# Patient Record
Sex: Female | Born: 1945 | Race: White | Hispanic: No | Marital: Married | Smoking: Former smoker
Health system: Southern US, Community
[De-identification: ages and names within clinical notes are randomized; demographics above are authoritative.]

## PROBLEM LIST (undated history)

## (undated) DIAGNOSIS — G479 Sleep disorder, unspecified: Secondary | ICD-10-CM

## (undated) DIAGNOSIS — C7800 Secondary malignant neoplasm of unspecified lung: Secondary | ICD-10-CM

## (undated) DIAGNOSIS — Z515 Encounter for palliative care: Secondary | ICD-10-CM

## (undated) DIAGNOSIS — C3490 Malignant neoplasm of unspecified part of unspecified bronchus or lung: Secondary | ICD-10-CM

## (undated) DIAGNOSIS — G20 Parkinson's disease: Secondary | ICD-10-CM

## (undated) DIAGNOSIS — M25519 Pain in unspecified shoulder: Secondary | ICD-10-CM

## (undated) DIAGNOSIS — I10 Essential (primary) hypertension: Secondary | ICD-10-CM

## (undated) DIAGNOSIS — G20A1 Parkinson's disease without dyskinesia, without mention of fluctuations: Secondary | ICD-10-CM

## (undated) DIAGNOSIS — Z923 Personal history of irradiation: Secondary | ICD-10-CM

## (undated) DIAGNOSIS — E785 Hyperlipidemia, unspecified: Secondary | ICD-10-CM

## (undated) HISTORY — DX: Essential (primary) hypertension: I10

## (undated) HISTORY — DX: Pain in unspecified shoulder: M25.519

## (undated) HISTORY — DX: Personal history of irradiation: Z92.3

## (undated) HISTORY — DX: Parkinson's disease: G20

## (undated) HISTORY — DX: Secondary malignant neoplasm of unspecified lung: C78.00

## (undated) HISTORY — DX: Encounter for palliative care: Z51.5

## (undated) HISTORY — DX: Parkinson's disease without dyskinesia, without mention of fluctuations: G20.A1

## (undated) HISTORY — DX: Malignant neoplasm of unspecified part of unspecified bronchus or lung: C34.90

## (undated) HISTORY — DX: Sleep disorder, unspecified: G47.9

## (undated) HISTORY — PX: TONSILLECTOMY: SUR1361

## (undated) HISTORY — DX: Hyperlipidemia, unspecified: E78.5

---

## 1999-05-30 ENCOUNTER — Other Ambulatory Visit: Admission: RE | Admit: 1999-05-30 | Discharge: 1999-05-30 | Payer: Self-pay | Admitting: Obstetrics and Gynecology

## 1999-05-30 ENCOUNTER — Encounter (INDEPENDENT_AMBULATORY_CARE_PROVIDER_SITE_OTHER): Payer: Self-pay

## 1999-09-05 ENCOUNTER — Encounter: Payer: Self-pay | Admitting: Obstetrics and Gynecology

## 1999-09-05 ENCOUNTER — Encounter: Admission: RE | Admit: 1999-09-05 | Discharge: 1999-09-05 | Payer: Self-pay | Admitting: Obstetrics and Gynecology

## 1999-09-11 ENCOUNTER — Encounter: Payer: Self-pay | Admitting: Obstetrics and Gynecology

## 1999-09-11 ENCOUNTER — Encounter: Admission: RE | Admit: 1999-09-11 | Discharge: 1999-09-11 | Payer: Self-pay | Admitting: Obstetrics and Gynecology

## 2000-09-11 ENCOUNTER — Encounter: Payer: Self-pay | Admitting: Obstetrics and Gynecology

## 2000-09-11 ENCOUNTER — Encounter: Admission: RE | Admit: 2000-09-11 | Discharge: 2000-09-11 | Payer: Self-pay | Admitting: Obstetrics and Gynecology

## 2001-09-22 ENCOUNTER — Ambulatory Visit (HOSPITAL_COMMUNITY): Admission: RE | Admit: 2001-09-22 | Discharge: 2001-09-22 | Payer: Self-pay | Admitting: Obstetrics and Gynecology

## 2001-09-22 ENCOUNTER — Encounter: Payer: Self-pay | Admitting: Obstetrics and Gynecology

## 2001-09-28 ENCOUNTER — Encounter: Payer: Self-pay | Admitting: Obstetrics and Gynecology

## 2001-09-28 ENCOUNTER — Encounter: Admission: RE | Admit: 2001-09-28 | Discharge: 2001-09-28 | Payer: Self-pay | Admitting: Obstetrics and Gynecology

## 2001-12-21 ENCOUNTER — Encounter: Admission: RE | Admit: 2001-12-21 | Discharge: 2001-12-21 | Payer: Self-pay | Admitting: Internal Medicine

## 2001-12-21 ENCOUNTER — Encounter: Payer: Self-pay | Admitting: Internal Medicine

## 2002-05-28 ENCOUNTER — Encounter: Payer: Self-pay | Admitting: Internal Medicine

## 2002-05-28 ENCOUNTER — Ambulatory Visit (HOSPITAL_COMMUNITY): Admission: RE | Admit: 2002-05-28 | Discharge: 2002-05-28 | Payer: Self-pay | Admitting: Internal Medicine

## 2002-10-07 ENCOUNTER — Encounter: Payer: Self-pay | Admitting: Obstetrics and Gynecology

## 2002-10-07 ENCOUNTER — Ambulatory Visit (HOSPITAL_COMMUNITY): Admission: RE | Admit: 2002-10-07 | Discharge: 2002-10-07 | Payer: Self-pay | Admitting: Obstetrics and Gynecology

## 2003-10-13 ENCOUNTER — Encounter: Admission: RE | Admit: 2003-10-13 | Discharge: 2003-10-13 | Payer: Self-pay | Admitting: Obstetrics and Gynecology

## 2004-09-21 ENCOUNTER — Ambulatory Visit (HOSPITAL_COMMUNITY): Admission: RE | Admit: 2004-09-21 | Discharge: 2004-09-21 | Payer: Self-pay | Admitting: Gastroenterology

## 2004-09-21 ENCOUNTER — Encounter (INDEPENDENT_AMBULATORY_CARE_PROVIDER_SITE_OTHER): Payer: Self-pay | Admitting: *Deleted

## 2004-10-18 ENCOUNTER — Encounter: Admission: RE | Admit: 2004-10-18 | Discharge: 2004-10-18 | Payer: Self-pay | Admitting: Obstetrics and Gynecology

## 2004-10-30 ENCOUNTER — Encounter: Admission: RE | Admit: 2004-10-30 | Discharge: 2004-10-30 | Payer: Self-pay | Admitting: Obstetrics and Gynecology

## 2005-07-25 ENCOUNTER — Other Ambulatory Visit: Admission: RE | Admit: 2005-07-25 | Discharge: 2005-07-25 | Payer: Self-pay | Admitting: Internal Medicine

## 2005-08-01 ENCOUNTER — Encounter: Admission: RE | Admit: 2005-08-01 | Discharge: 2005-08-01 | Payer: Self-pay | Admitting: Internal Medicine

## 2005-09-10 ENCOUNTER — Other Ambulatory Visit: Admission: RE | Admit: 2005-09-10 | Discharge: 2005-09-10 | Payer: Self-pay | Admitting: Obstetrics and Gynecology

## 2005-10-30 ENCOUNTER — Encounter: Admission: RE | Admit: 2005-10-30 | Discharge: 2005-10-30 | Payer: Self-pay | Admitting: Internal Medicine

## 2006-07-14 ENCOUNTER — Ambulatory Visit (HOSPITAL_COMMUNITY): Admission: RE | Admit: 2006-07-14 | Discharge: 2006-07-14 | Payer: Self-pay | Admitting: Neurology

## 2006-08-26 ENCOUNTER — Other Ambulatory Visit: Admission: RE | Admit: 2006-08-26 | Discharge: 2006-08-26 | Payer: Self-pay | Admitting: Obstetrics and Gynecology

## 2006-10-28 ENCOUNTER — Encounter: Admission: RE | Admit: 2006-10-28 | Discharge: 2006-10-28 | Payer: Self-pay | Admitting: *Deleted

## 2006-11-05 ENCOUNTER — Encounter: Admission: RE | Admit: 2006-11-05 | Discharge: 2006-11-05 | Payer: Self-pay | Admitting: Obstetrics and Gynecology

## 2006-12-02 ENCOUNTER — Ambulatory Visit: Payer: Self-pay | Admitting: Vascular Surgery

## 2007-09-03 ENCOUNTER — Other Ambulatory Visit: Admission: RE | Admit: 2007-09-03 | Discharge: 2007-09-03 | Payer: Self-pay | Admitting: Obstetrics and Gynecology

## 2007-11-19 ENCOUNTER — Encounter: Admission: RE | Admit: 2007-11-19 | Discharge: 2007-11-19 | Payer: Self-pay | Admitting: Obstetrics and Gynecology

## 2008-09-15 ENCOUNTER — Other Ambulatory Visit: Admission: RE | Admit: 2008-09-15 | Discharge: 2008-09-15 | Payer: Self-pay | Admitting: Obstetrics and Gynecology

## 2008-11-22 ENCOUNTER — Encounter: Admission: RE | Admit: 2008-11-22 | Discharge: 2008-11-22 | Payer: Self-pay | Admitting: Family Medicine

## 2009-09-19 ENCOUNTER — Other Ambulatory Visit: Admission: RE | Admit: 2009-09-19 | Discharge: 2009-09-19 | Payer: Self-pay | Admitting: Obstetrics and Gynecology

## 2009-11-24 ENCOUNTER — Encounter: Admission: RE | Admit: 2009-11-24 | Discharge: 2009-11-24 | Payer: Self-pay | Admitting: Family Medicine

## 2010-06-17 HISTORY — PX: SHOULDER SURGERY: SHX246

## 2010-10-30 NOTE — Consult Note (Signed)
VASCULAR SURGERY CONSULTATION   Savard, Teressa B  DOB:  02/19/1946                                       12/02/2006  ZOXWR#:[id number]   HISTORY OF PRESENT ILLNESS:  The patient is a 65 year old healthy female  who was recently found to have a right carotid bruit.  She denies any  neurologic symptoms such as previous stroke, hemispheric or  nonhemispheric, TIA, amaurosis fugax, diplopia or blurred vision,  syncope, hemiparesis, aphagia, etc.  She had a carotid duplex exam  performed at [facility] on Oct 28, 2006 which revealed a  moderate right external carotid stenosis but no internal carotid flow  reduction bilaterally.  She had not previously had an ultrasound study  done of her carotid arteries.   PAST MEDICAL HISTORY:  Negative for diabetes, coronary artery disease,  stroke, COPD.  She does have a history of hypertension, hyperlipidemia  treated medically.  She has also had a tremor in her left upper  extremity treated by Dr. Thad Ranger and also has diagnosis of restless leg  syndrome.   PAST SURGICAL HISTORY:  Includes tonsillectomy and childbirth.   FAMILY HISTORY:  Positive for coronary artery disease in her father who  died of a myocardial infarction.  Negative for stroke and diabetes.   SOCIAL HISTORY:  She is married, works as a Merchandiser, retail.  Quit smoking 15-  20 years ago and drinks an occasional glass of wine.   REVIEW OF SYSTEMS AND MEDICATIONS:  Please see health history form.   ALLERGIES:  None known.   PHYSICAL EXAMINATION:  Vital signs:  Blood pressure is 165/95, heart  rate is 88, respirations 18.  General:  She is a healthy appearing  middle aged female in no apparent distress, alert and oriented times 3.  Neck:  Supple, 3+ carotid pulses palpable.  There is a soft bruit in the  right supraclavicular area.  No palpable adenopathy in the neck.  Neurologic:  Normal.  Upper extremity pulses are 3+ bilaterally at the  brachial and  radial level.  Chest:  Clear to auscultation.  Cardiovascular:  Regular rhythm, no murmurs.  Abdomen:  Soft and  nontender with no palpable masses.  She has 3+ femoral, popliteal, and  posterior tibial pulses bilaterally with no distal edema or venous  disease noted.   I discussed with her the importance of her ultrasound and the fact that  she does not require any treatment since there is no flow reduction in  her internal carotid arteries and she is totally asymptomatic.  The  external carotid stenosis does not require treatment.  She does have  some atherosclerosis obviously to have this finding so I would repeat  her study in a couple of years or if she develops symptoms in the  interim; but otherwise she does not need any specific followup.   Quita Skye Hart Rochester, M.D.  Electronically Signed  JDL/MEDQ  D:  12/02/2006  T:  12/03/2006  Job:  [id number]   cc:   Fredirick Lathe, M.D.

## 2010-11-02 NOTE — Op Note (Signed)
NAMECAROLINA, [name] Pacheco               ACCOUNT NO.:  [id number]   MEDICAL RECORD NO.:  [id number]          PATIENT TYPE:  AMB   LOCATION:  ENDO                         FACILITY:  [facility]   PHYSICIAN:  Anselmo Rod, M.D.  DATE OF BIRTH:  1946-04-24   DATE OF PROCEDURE:  09/21/2004  DATE OF DISCHARGE:                                 OPERATIVE REPORT   PROCEDURE PERFORMED:  Colonoscopy with biopsies.   ENDOSCOPIST:  Anselmo Rod, M.D.   INSTRUMENT USED:  Olympus video colonoscope.   INDICATIONS FOR PROCEDURE:  A 65 year old white female undergoing screening  colonoscopy.  The patient has a history of occasional BRBPR.  Rule out  colonic polyps, masses, etc.   PRE-PROCEDURE PREPARATION:  Informed consent was procured from the patient.  The patient was fasted for eight hours prior to the procedure and prepped  with a bottle of magnesium citrate and a gallon of GoLYTELY the night prior  to the procedure.  Risks and benefits of the procedure including a 10% miss  rate of cancer and polyps were discussed with the patient as well.   PRE-PROCEDURE PHYSICAL:  VITAL SIGNS:  The patient had stable vital signs.  NECK:  Supple.  CHEST:  Clear to auscultation.  CARDIOVASCULAR:  S1, S2 regular.  ABDOMEN:  Soft, with normal bowel sounds.   DESCRIPTION OF THE PROCEDURE:  The patient was placed in the left lateral  decubitus position, sedated with 75 mg of Demerol and 7.5 mg of Versed in  slow incremental doses.  Once the patient was adequately sedated and  maintained on low-flow oxygen and continuous cardiac monitoring, the Olympus  video colonoscope was advanced from the rectum to the cecum.  The  appendiceal orifice and the ileocecal valve were clearly visualized and  photographed.  No masses, polyps, erosions, or ulcerations were seen.  There  was evidence of diverticulosis throughout the colon, with most of the  diverticula in the early stages of formation.  Retroflexion in the rectum  revealed a small internal hemorrhoid.  A prominent fold with a question  diverticulum was seen in the rectosigmoid colon.  There was some erythema on  this fold.  Biopsies were done to rule out adenoma.  The patient tolerated  the procedure well, without complications.  The terminal ileum appeared  healthy and without lesions.   IMPRESSION:  1.  Small external hemorrhoid.  2.  Diverticulosis throughout the colon, in early stages of formation.  3.  Prominent fold, with a question diverticulum, biopsied from rectosigmoid      colon.  4.  Normal terminal ileum.   RECOMMENDATIONS:  1.  Await biopsy results.  2.  Avoid nonsteroidals, including aspirin, for the next two weeks.  3.  Outpatient followup in the next two weeks for further recommendations.      JNM/MEDQ  D:  09/21/2004  T:  09/21/2004  Job:  [id number]   cc:   Marcene Duos, M.D.  [address]  [place]  [place] [address]  Fax: [phone]

## 2010-11-08 ENCOUNTER — Other Ambulatory Visit (HOSPITAL_BASED_OUTPATIENT_CLINIC_OR_DEPARTMENT_OTHER): Payer: Self-pay | Admitting: Orthopedic Surgery

## 2010-11-08 DIAGNOSIS — R52 Pain, unspecified: Secondary | ICD-10-CM

## 2010-11-14 ENCOUNTER — Ambulatory Visit (HOSPITAL_BASED_OUTPATIENT_CLINIC_OR_DEPARTMENT_OTHER)
Admission: RE | Admit: 2010-11-14 | Discharge: 2010-11-14 | Disposition: A | Discharge status: Home / Self Care | Payer: BC Managed Care – PPO | Source: Ambulatory Visit | Attending: Orthopedic Surgery | Admitting: Orthopedic Surgery

## 2010-11-14 DIAGNOSIS — M25519 Pain in unspecified shoulder: Secondary | ICD-10-CM | POA: Insufficient documentation

## 2010-11-14 DIAGNOSIS — M67919 Unspecified disorder of synovium and tendon, unspecified shoulder: Secondary | ICD-10-CM | POA: Insufficient documentation

## 2010-11-14 DIAGNOSIS — R52 Pain, unspecified: Secondary | ICD-10-CM

## 2010-11-14 DIAGNOSIS — M719 Bursopathy, unspecified: Secondary | ICD-10-CM | POA: Insufficient documentation

## 2010-11-14 DIAGNOSIS — M19019 Primary osteoarthritis, unspecified shoulder: Secondary | ICD-10-CM | POA: Insufficient documentation

## 2010-12-07 ENCOUNTER — Encounter (HOSPITAL_BASED_OUTPATIENT_CLINIC_OR_DEPARTMENT_OTHER)
Admission: RE | Admit: 2010-12-07 | Discharge: 2010-12-07 | Disposition: A | Discharge status: Home / Self Care | Payer: BC Managed Care – PPO | Source: Ambulatory Visit | Attending: Orthopedic Surgery | Admitting: Orthopedic Surgery

## 2010-12-07 LAB — BASIC METABOLIC PANEL
BUN: 18 mg/dL (ref 6–23)
CO2: 29 mEq/L (ref 19–32)
Calcium: 9.5 mg/dL (ref 8.4–10.5)
Chloride: 103 mEq/L (ref 96–112)
Creatinine, Ser: 0.58 mg/dL (ref 0.50–1.10)
Glucose, Bld: 98 mg/dL (ref 70–99)
Potassium: 3.5 mEq/L (ref 3.5–5.1)

## 2010-12-10 ENCOUNTER — Ambulatory Visit (HOSPITAL_BASED_OUTPATIENT_CLINIC_OR_DEPARTMENT_OTHER)
Admission: RE | Admit: 2010-12-10 | Discharge: 2010-12-10 | Disposition: A | Discharge status: Home / Self Care | Payer: BC Managed Care – PPO | Source: Ambulatory Visit | Attending: Orthopedic Surgery | Admitting: Orthopedic Surgery

## 2010-12-10 DIAGNOSIS — M25819 Other specified joint disorders, unspecified shoulder: Secondary | ICD-10-CM | POA: Insufficient documentation

## 2010-12-10 DIAGNOSIS — Z01810 Encounter for preprocedural cardiovascular examination: Secondary | ICD-10-CM | POA: Insufficient documentation

## 2010-12-10 DIAGNOSIS — M75110 Incomplete rotator cuff tear or rupture of unspecified shoulder, not specified as traumatic: Secondary | ICD-10-CM | POA: Insufficient documentation

## 2010-12-10 DIAGNOSIS — Z01812 Encounter for preprocedural laboratory examination: Secondary | ICD-10-CM | POA: Insufficient documentation

## 2010-12-10 DIAGNOSIS — M24119 Other articular cartilage disorders, unspecified shoulder: Secondary | ICD-10-CM | POA: Insufficient documentation

## 2010-12-10 LAB — POCT HEMOGLOBIN-HEMACUE: Hemoglobin: 14.3 g/dL (ref 12.0–15.0)

## 2010-12-26 NOTE — Op Note (Signed)
[name], [name]               ACCOUNT NO.:  [id number]  MEDICAL RECORD NO.:  [id number]  LOCATION:  XRAY                          FACILITY:  [facility]  PHYSICIAN:  Shaylinn Hladik A. Thurston Hole, M.D. DATE OF BIRTH:  1945/07/28  DATE OF PROCEDURE:  12/10/2010 DATE OF DISCHARGE:  11/14/2010                              OPERATIVE REPORT   PREOPERATIVE DIAGNOSES: 1. Left shoulder partial labrum tear. 2. Left shoulder rotator cuff tendonitis with impingement.  POSTOPERATIVE DIAGNOSES: 1. Left shoulder partial labrum tear. 2. Left shoulder rotator cuff tendonitis with impingement.  PROCEDURE: 1. Left shoulder exam under anesthesia followed by arthroscopic     debridement and partial labrum tear. 2. Left shoulder subacromial decompression.  SURGEON:  Elana Alm. Thurston Hole, MD  ANESTHESIA:  General.  OPERATIVE TIME:  45 minutes.  COMPLICATIONS:  None.  INDICATIONS FOR PROCEDURE:  [name] Pacheco is a 65 year old woman who has had significant increasing left shoulder pain over the past 6 months with exam and MRI documenting partial labrum and biceps tear with impingement.  She has failed conservative care and is now to undergo arthroscopy.  DESCRIPTION:  [name] Pacheco was brought to the operating room on December 10, 2010, after an interscalene block was placed in the holding room by Anesthesia.  She was placed on the operating table in supine position. She received Ancef 1 g IV preoperatively for prophylaxis.  After being placed under general anesthesia, her left shoulder was examined.  She had full range of motion and her shoulder was stable to ligamentous exam.  She was then placed in a beach-chair position.  Her shoulder and arm was prepped using sterile DuraPrep and draped using sterile technique.  Time-out procedure was called and the correct left shoulder identified.  Initially, through a posterior arthroscopic portal the arthroscope with a pump attached was placed into an anterior portal  and arthroscopic probe was placed.  On initial inspection, the articular cartilage in the glenohumeral joint was found to be intact.  She had partial tearing of the anterior, superior and posterior labrum 25% which was debrided.  The anterior-inferior labrum and anterior-inferior glenohumeral ligament complex was intact.  Biceps tendon anchor was slightly hypermobile but was otherwise well attached.  Biceps tendon was intact.  Rotator cuff was thoroughly inspected on the articular surface and there was found to be no evidence of a tear.  Inferior capsular recess free of pathology.  Subacromial space was entered and lateral arthroscopic portal was made.  Moderately thickened bursitis was resected.  The rotator cuff was somewhat inflamed and thickened on the bursal surface but no evidence of a tear.  Impingement was noted and a subacromial decompression was carried out removing 6 mm of the undersurface of the anterior, anterolateral, and anteromedial acromion and CA ligament release was carried out as well.  The [facility] joint was not pathologic and thus was not resected.  After this was done, shoulder could be brought through a satisfactory range of motion with no impingement on the rotator cuff.  At this point, it was felt that all pathology have been satisfactorily addressed.  The instruments removed. Portals closed with 3-0 nylon suture.  Sterile dressings and a sling applied and  the patient awakened and taken to recovery in stable condition.  FOLLOWUP CARE:  [name] will be followed as an outpatient on Percocet and Mobic.  She will be back in the office in a week for sutures out and followup.     [name] Pacheco A. Thurston Hole, M.D.     RAW/MEDQ  D:  12/10/2010  T:  12/10/2010  Job:  [id number]  Electronically Signed by Salvatore Marvel M.D. on 12/26/2010 11:26:19 AM

## 2011-01-27 ENCOUNTER — Encounter: Payer: Self-pay | Admitting: Emergency Medicine

## 2011-01-27 ENCOUNTER — Inpatient Hospital Stay (INDEPENDENT_AMBULATORY_CARE_PROVIDER_SITE_OTHER)
Admission: RE | Admit: 2011-01-27 | Discharge: 2011-01-27 | Disposition: A | Discharge status: Home / Self Care | Payer: BC Managed Care – PPO | Source: Ambulatory Visit | Attending: Emergency Medicine | Admitting: Emergency Medicine

## 2011-01-27 DIAGNOSIS — J209 Acute bronchitis, unspecified: Secondary | ICD-10-CM | POA: Insufficient documentation

## 2011-01-27 DIAGNOSIS — J0100 Acute maxillary sinusitis, unspecified: Secondary | ICD-10-CM

## 2011-01-27 DIAGNOSIS — E785 Hyperlipidemia, unspecified: Secondary | ICD-10-CM | POA: Insufficient documentation

## 2011-01-27 DIAGNOSIS — I10 Essential (primary) hypertension: Secondary | ICD-10-CM | POA: Insufficient documentation

## 2011-02-20 ENCOUNTER — Other Ambulatory Visit (HOSPITAL_BASED_OUTPATIENT_CLINIC_OR_DEPARTMENT_OTHER): Payer: Self-pay | Admitting: Pediatrics

## 2011-02-20 DIAGNOSIS — Z1231 Encounter for screening mammogram for malignant neoplasm of breast: Secondary | ICD-10-CM

## 2011-02-26 ENCOUNTER — Ambulatory Visit (HOSPITAL_BASED_OUTPATIENT_CLINIC_OR_DEPARTMENT_OTHER)
Admission: RE | Admit: 2011-02-26 | Discharge: 2011-02-26 | Disposition: A | Discharge status: Home / Self Care | Payer: BC Managed Care – PPO | Source: Ambulatory Visit | Attending: Pediatrics | Admitting: Pediatrics

## 2011-02-26 DIAGNOSIS — Z1231 Encounter for screening mammogram for malignant neoplasm of breast: Secondary | ICD-10-CM | POA: Insufficient documentation

## 2011-05-20 NOTE — Progress Notes (Signed)
Summary: CONGESTION(rm4)   Vital Signs:  Patient Profile:   65 Years Old Female CC:      congestion Height:     64 inches Weight:      170.8 pounds O2 Sat:      99 % O2 treatment:    Room Air Temp:     98.1 degrees F oral Pulse rate:   73 / minute Resp:     24 per minute BP sitting:   128 / 84  (left arm)  Vitals Entered By: Linton Flemings RN (January 27, 2011 1:22 PM)                  Updated Prior Medication List: MIRAPEX 1 MG TABS (PRAMIPEXOLE DIHYDROCHLORIDE) 3 1/2 tabs daily ZETIA 10 MG TABS (EZETIMIBE) daily ZOCOR 20 MG TABS (SIMVASTATIN) daily ASPIRIN 81 MG TBEC (ASPIRIN) daily CALTRATE 600+D 600-400 MG-UNIT TABS (CALCIUM CARBONATE-VITAMIN D) daily GLUCOSAMINE 500 MG CAPS (GLUCOSAMINE SULFATE) 2 tabs daily * PRILOSEC daily AMBIEN CR 6.25 MG CR-TABS (ZOLPIDEM TARTRATE) daily * LOPRESSOR 25 MG TABS (METOPROLOL TARTRATE) daily healthy women D3 MVI Current Allergies: No known allergies History of Present Illness History from: patient Chief Complaint: congestion History of Present Illness: Pt complains of  4 days of congestion, colored rhinohrhea No sore throat.  + cough, No dyspnea. No chest pain.  + rare wheezing.  No nausea No vomiting. + fever, No chills otc meds not helping.   REVIEW OF SYSTEMS Constitutional Symptoms       Complains of fever and chills.     Denies night sweats, weight loss, weight gain, and fatigue.  Eyes       Complains of glasses.      Denies change in vision, eye pain, eye discharge, contact lenses, and eye surgery. Ear/Nose/Throat/Mouth       Complains of frequent runny nose, sore throat, and hoarseness.      Denies hearing loss/aids, change in hearing, ear pain, ear discharge, dizziness, frequent nose bleeds, sinus problems, and tooth pain or bleeding.  Respiratory       Complains of dry cough, wheezing, and shortness of breath.      Denies productive cough, asthma, bronchitis, and emphysema/COPD.  Cardiovascular  Complains of chest pain.      Denies murmurs and tires easily with exhertion.      Comments: from coughing   Gastrointestinal       Complains of nausea/vomiting.      Denies stomach pain, diarrhea, constipation, blood in bowel movements, and indigestion.      Comments: nausea Genitourniary       Denies painful urination, kidney stones, and loss of urinary control. Neurological       Complains of headaches.      Denies paralysis, seizures, and fainting/blackouts. Musculoskeletal       Denies muscle pain, joint pain, joint stiffness, decreased range of motion, redness, swelling, muscle weakness, and gout.  Skin       Denies bruising, unusual mles/lumps or sores, and hair/skin or nail changes.  Psych       Denies mood changes, temper/anger issues, anxiety/stress, speech problems, depression, and sleep problems. Other Comments: symptoms started Thursday and getting worse.   Past History:  Past Medical History: Hyperlipidemia Hypertension  Past Surgical History: shoulder  Social History: smoke- no alcohol-yes- glass of wine daily rec.drugs-no Physical Exam General appearance: well developed, well nourished, no acute distress Head: normocephalic, atraumatic. Sounds congested. Eyes: conjunctivae and lids normal Ears: normal, no lesions or deformities  Nasal: swollen red turbinates with congestion. Yellow d/c Oral/Pharynx: tongue normal, posterior pharynx without erythema or exudate Neck: neck supple,  trachea midline, no masses Chest/Lungs: scattered rhonchi. Rare late exp wheezes, but good air movement. No rales. Heart: regular rate and  rhythm, no murmur Extremities: normal extremities Skin: no obvious rashes or lesions MSE: oriented to time, place, and person Assessment New Problems: ACUTE BRONCHITIS (ICD-466.0) ACUTE MAXILLARY SINUSITIS (ICD-461.0) HYPERTENSION (ICD-401.9) HYPERLIPIDEMIA (ICD-272.4)   Patient Education: Patient and/or caregiver instructed in the  following: rest fluids and Tylenol.  Plan New Medications/Changes: PROMETHAZINE-DM 6.25-15 MG/5ML SYRP (PROMETHAZINE-DM) 1 or 2 teaspoons q 4-6 hours as needed for cough  #120 ml x 0, 01/27/2011, Lajean Manes MD VENTOLIN HFA 108 (90 BASE) MCG/ACT AERS (ALBUTEROL SULFATE) 2 INH q 4-6 hrs as needed for wheezing  #1 x 0, 01/27/2011, Lajean Manes MD AMOXICILLIN 500 MG CAPS (AMOXICILLIN) 1 by mouth three times a day for 10 days  #30 x 0, 01/27/2011, Lajean Manes MD  New Orders: Services provided After hours-Weekends-Holidays [99051] Est. Patient Level IV [86578] Planning Comments:   Mucinex, other otc methods discussed. Risks, benefits, alternatives discussed. Pt voiced understanding and agreement.  Follow Up: Follow up on an as needed basis, Follow up with Primary Physician  The patient and/or caregiver has been counseled thoroughly with regard to medications prescribed including dosage, schedule, interactions, rationale for use, and possible side effects and they verbalize understanding.  Diagnoses and expected course of recovery discussed and will return if not improved as expected or if the condition worsens. Patient and/or caregiver verbalized understanding.  Prescriptions: PROMETHAZINE-DM 6.25-15 MG/5ML SYRP (PROMETHAZINE-DM) 1 or 2 teaspoons q 4-6 hours as needed for cough  #120 ml x 0   Entered and Authorized by:   Lajean Manes MD   Signed by:   Lajean Manes MD on 01/27/2011   Method used:   Electronically to        [facility] [phone]* (retail)       [address]       [place], [place]  [address]       Ph: [phone]       Fax: [phone]   RxID:   [id number] VENTOLIN HFA 108 (90 BASE) MCG/ACT AERS (ALBUTEROL SULFATE) 2 INH q 4-6 hrs as needed for wheezing  #1 x 0   Entered and Authorized by:   Lajean Manes MD   Signed by:   Lajean Manes MD on 01/27/2011   Method used:   Electronically to        [facility] [phone]* (retail)        [address]       [place], [place]  [address]       Ph: [phone]       Fax: [phone]   RxID:   [id number] AMOXICILLIN 500 MG CAPS (AMOXICILLIN) 1 by mouth three times a day for 10 days  #30 x 0   Entered and Authorized by:   Lajean Manes MD   Signed by:   Lajean Manes MD on 01/27/2011   Method used:   [facility]ronically to        [facility] [phone]* (retail)       [address]       [place], [place]  [address]       Ph: [phone]  Fax: [phone]   RxID:   [id number]   Orders Added: 1)  Services provided After hours-Weekends-Holidays [99051] 2)  Est. Patient Level IV [30865]

## 2011-06-26 ENCOUNTER — Other Ambulatory Visit: Payer: Self-pay | Admitting: Dermatology

## 2011-09-26 ENCOUNTER — Other Ambulatory Visit: Payer: Self-pay | Admitting: Dermatology

## 2012-03-05 ENCOUNTER — Other Ambulatory Visit (HOSPITAL_BASED_OUTPATIENT_CLINIC_OR_DEPARTMENT_OTHER): Payer: Self-pay | Admitting: Family Medicine

## 2012-03-05 ENCOUNTER — Ambulatory Visit (HOSPITAL_BASED_OUTPATIENT_CLINIC_OR_DEPARTMENT_OTHER)
Admission: RE | Admit: 2012-03-05 | Discharge: 2012-03-05 | Disposition: A | Discharge status: Home / Self Care | Payer: BC Managed Care – PPO | Source: Ambulatory Visit | Attending: Family Medicine | Admitting: Family Medicine

## 2012-03-05 DIAGNOSIS — Z1231 Encounter for screening mammogram for malignant neoplasm of breast: Secondary | ICD-10-CM

## 2012-07-10 DIAGNOSIS — G479 Sleep disorder, unspecified: Secondary | ICD-10-CM | POA: Insufficient documentation

## 2012-08-11 ENCOUNTER — Ambulatory Visit (INDEPENDENT_AMBULATORY_CARE_PROVIDER_SITE_OTHER): Payer: Medicare Other | Admitting: Family Medicine

## 2012-08-11 ENCOUNTER — Encounter: Payer: Self-pay | Admitting: Family Medicine

## 2012-08-11 VITALS — BP 130/83 | HR 63 | Ht 63.0 in | Wt 172.0 lb

## 2012-08-11 DIAGNOSIS — M25519 Pain in unspecified shoulder: Secondary | ICD-10-CM

## 2012-08-11 DIAGNOSIS — M25512 Pain in left shoulder: Secondary | ICD-10-CM

## 2012-08-11 MED ORDER — METHYLPREDNISOLONE 4 MG PO KIT: PACK | ORAL | Status: DC

## 2012-08-11 NOTE — Patient Instructions (Addendum)
Your exam is suggestive of muscle spasms of trapezius, pectoralis, subscapular muscles or irritated nerve in your neck. Take prednisone as directed x 6 days with food. AFTER finishing this it's ok to go back to advil 3 tabs three times a day with food. Start physical therapy and home exercise program. Follow up with me in 1 month for reevaluation.

## 2012-08-12 ENCOUNTER — Ambulatory Visit: Payer: Medicare Other | Attending: Family Medicine | Admitting: Rehabilitation

## 2012-08-12 ENCOUNTER — Encounter: Payer: Self-pay | Admitting: Family Medicine

## 2012-08-12 DIAGNOSIS — M25512 Pain in left shoulder: Secondary | ICD-10-CM | POA: Insufficient documentation

## 2012-08-12 DIAGNOSIS — M542 Cervicalgia: Secondary | ICD-10-CM | POA: Insufficient documentation

## 2012-08-12 DIAGNOSIS — M25519 Pain in unspecified shoulder: Secondary | ICD-10-CM | POA: Insufficient documentation

## 2012-08-12 DIAGNOSIS — IMO0001 Reserved for inherently not codable concepts without codable children: Secondary | ICD-10-CM | POA: Insufficient documentation

## 2012-08-12 NOTE — Progress Notes (Signed)
  Subjective:    Patient ID: [name] Pacheco, female    DOB: Jan 23, 1946, 67 y.o.   MRN: [id number]  PCP: Claude Manges  HPI 67 yo F here for left shoulder pain.  Patient reports about 3 weeks ago she recalls reaching and handling a box higher up on a shelf. Felt uncomfortable to do so but no acute injury. Pain started in left shoulder area but included upper back, chest, upper arm. Hurts with certain movements including pushing hands together, lifting. No night pain. Constant ache that has gotten worse over past 3 weeks. Taking advil, using heat. No numbness or tingling. No swelling or bruising.  Past Medical History  Diagnosis Date  . Hypertension   . Hyperlipidemia   . Parkinson disease     No current outpatient prescriptions on file prior to visit.   No current facility-administered medications on file prior to visit.    Past Surgical History  Procedure Laterality Date  . Tonsillectomy    . Shoulder surgery Left 2012    arthroscopy, debridement    No Known Allergies  History   Social History  . Marital Status: Married    Spouse Name: N/A    Number of Children: N/A  . Years of Education: N/A   Occupational History  . Not on file.   Social History Main Topics  . Smoking status: Former Games developer  . Smokeless tobacco: Not on file  . Alcohol Use: Not on file  . Drug Use: Not on file  . Sexually Active: Not on file   Other Topics Concern  . Not on file   Social History Narrative  . No narrative on file    Family History  Problem Relation Age of Onset  . Sudden death Mother   . Sudden death Father   . Heart attack Father     BP 130/83  Pulse 63  Ht 5\' 3"  (1.6 m)  Wt 172 lb (78.019 kg)  BMI 30.48 kg/m2  Review of Systems See HPI above.    Objective:   Physical Exam Gen: NAD  Neck: No gross deformity, swelling, bruising. TTP upper trapezius, pectoralis and upper arm reproducing her pain.  No midline/bony TTP. FROM neck without pain. BUE  strength 5/5.   Sensation intact to light touch.   2+ equal reflexes in triceps, biceps, brachioradialis tendons. Negative spurlings. NV intact distal BUEs.  L shoulder: No swelling, ecchymoses.  No gross deformity. No TTP. FROM. Negative Hawkins, Neers. Negative Speeds, Yergasons. Strength 5/5 with empty can and resisted internal/external rotation.  Pain reproduced with resisted IR. Negative apprehension. NV intact distally.    Assessment & Plan:  1. Left shoulder pain - Consistent with muscle spasms of trapezius, pectoralis, subscapular muscles based on resisted motions, exam, started when moving a box on a high shelf.  Start with physical therapy, home exercise program.  She will trial prednisone as this could represent irritated cervical nerve causing spasms in these muscle groups as well.  After finishing this, nsaids, tylenol as needed.  Heat as needed.  F/u in 1 month.

## 2012-08-12 NOTE — Assessment & Plan Note (Signed)
Consistent with muscle spasms of trapezius, pectoralis, subscapular muscles based on resisted motions, exam, started when moving a box on a high shelf.  Start with physical therapy, home exercise program.  She will trial prednisone as this could represent irritated cervical nerve causing spasms in these muscle groups as well.  After finishing this, nsaids, tylenol as needed.  Heat as needed.  F/u in 1 month.

## 2012-08-19 ENCOUNTER — Ambulatory Visit: Payer: Medicare Other | Attending: Family Medicine | Admitting: Rehabilitation

## 2012-08-19 DIAGNOSIS — IMO0001 Reserved for inherently not codable concepts without codable children: Secondary | ICD-10-CM | POA: Insufficient documentation

## 2012-08-19 DIAGNOSIS — M542 Cervicalgia: Secondary | ICD-10-CM | POA: Insufficient documentation

## 2012-08-19 DIAGNOSIS — M25519 Pain in unspecified shoulder: Secondary | ICD-10-CM | POA: Insufficient documentation

## 2012-08-20 ENCOUNTER — Ambulatory Visit (INDEPENDENT_AMBULATORY_CARE_PROVIDER_SITE_OTHER): Payer: Medicare Other | Admitting: Family Medicine

## 2012-08-20 ENCOUNTER — Encounter: Payer: Self-pay | Admitting: Family Medicine

## 2012-08-20 VITALS — BP 122/83 | Ht 63.0 in | Wt 171.0 lb

## 2012-08-20 DIAGNOSIS — M25512 Pain in left shoulder: Secondary | ICD-10-CM

## 2012-08-20 DIAGNOSIS — M25519 Pain in unspecified shoulder: Secondary | ICD-10-CM

## 2012-08-20 DIAGNOSIS — R079 Chest pain, unspecified: Secondary | ICD-10-CM

## 2012-08-20 MED ORDER — CYCLOBENZAPRINE HCL 5 MG PO TABS: 5.0000 mg | ORAL_TABLET | Freq: Three times a day (TID) | ORAL | Status: DC | PRN

## 2012-08-20 MED ORDER — HYDROCODONE-ACETAMINOPHEN 5-325 MG PO TABS: 1.0000 | ORAL_TABLET | Freq: Four times a day (QID) | ORAL | Status: DC | PRN

## 2012-08-24 ENCOUNTER — Encounter: Payer: Self-pay | Admitting: Family Medicine

## 2012-08-24 NOTE — Assessment & Plan Note (Signed)
Consistent with muscle spasms of trapezius, pectoralis, subscapular muscles based on resisted motions, exam, started when moving a box on a high shelf.  Did EKG as a precaution and do not see evidence of ischemic changes (her history besides location of pain goes against this as well).  I cannot think of anything further to add in workup or treatment for these issues.  Discussed muscle relaxants but these do not fix the problem.  Is now taking norco as needed for pain.  Continue with PT.  Could possibly consider seeing a rheumatologist.  Do not think imaging of neck, shoulder, or chest would warrant a treatable underlying cause of her pain outside of our current treatment plan.  She is not febrile and reports no other symptoms besides pain with movement, palpation.  F/u in 1 month.  Could also discuss with her PCP for possible other causes.

## 2012-08-24 NOTE — Progress Notes (Signed)
Subjective:    Patient ID: [name] Pacheco, female    DOB: 1945-11-14, 67 y.o.   MRN: [id number]  PCP: Claude Manges  Shoulder Pain    67 yo F here for f/u left shoulder pain.  2/26: Patient reports about 3 weeks ago she recalls reaching and handling a box higher up on a shelf. Felt uncomfortable to do so but no acute injury. Pain started in left shoulder area but included upper back, chest, upper arm. Hurts with certain movements including pushing hands together, lifting. No night pain. Constant ache that has gotten worse over past 3 weeks. Taking advil, using heat. No numbness or tingling. No swelling or bruising.  3/6: Patient returns as pain has not improved since last visit. Finished prednisone with maybe some mild benefit. Taking ibuprofen, started PT. Heat helps some. Pain is within shoulder and chest, neck aching also. No numbness/tingling. No radiation. Still hurts with movements of arms (pressing) and pushing on chest wall near sternum.  Past Medical History  Diagnosis Date  . Hypertension   . Hyperlipidemia   . Parkinson disease     Current Outpatient Prescriptions on File Prior to Visit  Medication Sig Dispense Refill  . aspirin 81 MG tablet Take 81 mg by mouth daily.      Marland Kitchen ezetimibe (ZETIA) 10 MG tablet Take 10 mg by mouth daily.      . metoprolol succinate (TOPROL-XL) 25 MG 24 hr tablet       . omeprazole (PRILOSEC) 10 MG capsule Take 10 mg by mouth daily.      . pramipexole (MIRAPEX) 1 MG tablet Take by mouth. Dosage 1mg . Takes 3 1/2 tablets daily      . simvastatin (ZOCOR) 20 MG tablet Take 20 mg by mouth daily.      Marland Kitchen zolpidem (AMBIEN CR) 6.25 MG CR tablet        No current facility-administered medications on file prior to visit.    Past Surgical History  Procedure Laterality Date  . Tonsillectomy    . Shoulder surgery Left 2012    arthroscopy, debridement    No Known Allergies  History   Social History  . Marital Status: Married   Spouse Name: N/A    Number of Children: N/A  . Years of Education: N/A   Occupational History  . Not on file.   Social History Main Topics  . Smoking status: Former Games developer  . Smokeless tobacco: Not on file  . Alcohol Use: Not on file  . Drug Use: Not on file  . Sexually Active: Not on file   Other Topics Concern  . Not on file   Social History Narrative  . No narrative on file    Family History  Problem Relation Age of Onset  . Sudden death Mother   . Sudden death Father   . Heart attack Father     BP 122/83  Ht 5\' 3"  (1.6 m)  Wt 171 lb (77.565 kg)  BMI 30.3 kg/m2  Review of Systems  See HPI above.    Objective:   Physical Exam  Gen: NAD  Neck: No gross deformity, swelling, bruising. TTP upper trapezius, pectoralis and upper arm reproducing her pain.  TTP sternocostal area anteriorly around 3rd, 4th ribs.  No midline/bony TTP. FROM neck without pain. BUE strength 5/5.   Sensation intact to light touch.   2+ equal reflexes in triceps, biceps, brachioradialis tendons. Negative spurlings. NV intact distal BUEs.  L shoulder: No swelling, ecchymoses.  No gross deformity. No TTP. FROM. Negative Hawkins, Neers. Negative Speeds, Yergasons. Strength 5/5 with empty can and resisted internal/external rotation.  Pain reproduced with resisted IR. Negative apprehension. NV intact distally.  EKG performed as a precaution: normal sinus rhythm without concordant abnormal T waves, ST segments, other evidence of MI or ischemia.    Assessment & Plan:  1. Left shoulder pain - Consistent with muscle spasms of trapezius, pectoralis, subscapular muscles based on resisted motions, exam, started when moving a box on a high shelf.  Did EKG as a precaution and do not see evidence of ischemic changes (her history besides location of pain goes against this as well).  I cannot think of anything further to add in workup or treatment for these issues.  Discussed muscle relaxants but  these do not fix the problem.  Is now taking norco as needed for pain.  Continue with PT.  Could possibly consider seeing a rheumatologist.  Do not think imaging of neck, shoulder, or chest would warrant a treatable underlying cause of her pain outside of our current treatment plan.  She is not febrile and reports no other symptoms besides pain with movement, palpation.  F/u in 1 month.  Could also discuss with her PCP for possible other causes.

## 2012-08-26 ENCOUNTER — Encounter: Payer: Medicare Other | Admitting: Rehabilitation

## 2012-08-31 ENCOUNTER — Encounter: Payer: Self-pay | Admitting: Family Medicine

## 2012-08-31 ENCOUNTER — Ambulatory Visit (HOSPITAL_BASED_OUTPATIENT_CLINIC_OR_DEPARTMENT_OTHER)
Admission: RE | Admit: 2012-08-31 | Discharge: 2012-08-31 | Disposition: A | Discharge status: Home / Self Care | Payer: Medicare Other | Source: Ambulatory Visit | Attending: Family Medicine | Admitting: Family Medicine

## 2012-08-31 ENCOUNTER — Other Ambulatory Visit: Payer: Self-pay | Admitting: Family Medicine

## 2012-08-31 ENCOUNTER — Ambulatory Visit (INDEPENDENT_AMBULATORY_CARE_PROVIDER_SITE_OTHER): Payer: Medicare Other | Admitting: Family Medicine

## 2012-08-31 VITALS — BP 142/91 | HR 94 | Ht 63.0 in | Wt 172.0 lb

## 2012-08-31 DIAGNOSIS — I10 Essential (primary) hypertension: Secondary | ICD-10-CM | POA: Insufficient documentation

## 2012-08-31 DIAGNOSIS — G20 Parkinson's disease: Secondary | ICD-10-CM | POA: Insufficient documentation

## 2012-08-31 DIAGNOSIS — M25519 Pain in unspecified shoulder: Secondary | ICD-10-CM | POA: Insufficient documentation

## 2012-08-31 DIAGNOSIS — R079 Chest pain, unspecified: Secondary | ICD-10-CM | POA: Insufficient documentation

## 2012-08-31 DIAGNOSIS — C801 Malignant (primary) neoplasm, unspecified: Secondary | ICD-10-CM

## 2012-08-31 DIAGNOSIS — G20A1 Parkinson's disease without dyskinesia, without mention of fluctuations: Secondary | ICD-10-CM | POA: Insufficient documentation

## 2012-08-31 LAB — COMPREHENSIVE METABOLIC PANEL
ALT: 22 U/L (ref 0–35)
AST: 16 U/L (ref 0–37)
Albumin: 3.9 g/dL (ref 3.5–5.2)
Alkaline Phosphatase: 82 U/L (ref 39–117)
Glucose, Bld: 94 mg/dL (ref 70–99)
Potassium: 3.7 mEq/L (ref 3.5–5.3)
Sodium: 139 mEq/L (ref 135–145)
Total Bilirubin: 0.2 mg/dL — ABNORMAL LOW (ref 0.3–1.2)
Total Protein: 6.4 g/dL (ref 6.0–8.3)

## 2012-08-31 NOTE — Progress Notes (Addendum)
Subjective:    Patient ID: [name] Pacheco, female    DOB: January 10, 1946, 67 y.o.   MRN: [id number]  PCP: Claude Manges  Shoulder Pain    67 yo F here for f/u left shoulder pain.  2/26: Patient reports about 3 weeks ago she recalls reaching and handling a box higher up on a shelf. Felt uncomfortable to do so but no acute injury. Pain started in left shoulder area but included upper back, chest, upper arm. Hurts with certain movements including pushing hands together, lifting. No night pain. Constant ache that has gotten worse over past 3 weeks. Taking advil, using heat. No numbness or tingling. No swelling or bruising.  3/6: Patient returns as pain has not improved since last visit. Finished prednisone with maybe some mild benefit. Taking ibuprofen, started PT. Heat helps some. Pain is within shoulder and chest, neck aching also. No numbness/tingling. No radiation. Still hurts with movements of arms (pressing) and pushing on chest wall near sternum.  3/17: Patient returns with persistent left shoulder pain. Has not yet seen PCP as we discussed previously. Now with some swelling and persistent pain anterior left chest wall at sternocostal, sternoclavicular area. Denies sweats, chills, fevers. No other systemic symptoms other than pain - no cough, shortness of breath. Up to date on mammogram and not due yet for repeat colonoscopy. No personal h/o cancer.  Past Medical History  Diagnosis Date  . Hypertension   . Hyperlipidemia   . Parkinson disease     Current Outpatient Prescriptions on File Prior to Visit  Medication Sig Dispense Refill  . aspirin 81 MG tablet Take 81 mg by mouth daily.      . cyclobenzaprine (FLEXERIL) 5 MG tablet Take 1 tablet (5 mg total) by mouth every 8 (eight) hours as needed for muscle spasms.  90 tablet  0  . ezetimibe (ZETIA) 10 MG tablet Take 10 mg by mouth daily.      Marland Kitchen HYDROcodone-acetaminophen (NORCO) 5-325 MG per tablet Take 1 tablet by  mouth every 6 (six) hours as needed for pain.  60 tablet  0  . metoprolol succinate (TOPROL-XL) 25 MG 24 hr tablet       . omeprazole (PRILOSEC) 10 MG capsule Take 10 mg by mouth daily.      . pramipexole (MIRAPEX) 1 MG tablet Take by mouth. Dosage 1mg . Takes 3 1/2 tablets daily      . simvastatin (ZOCOR) 20 MG tablet Take 20 mg by mouth daily.      Marland Kitchen zolpidem (AMBIEN CR) 6.25 MG CR tablet        No current facility-administered medications on file prior to visit.    Past Surgical History  Procedure Laterality Date  . Tonsillectomy    . Shoulder surgery Left 2012    arthroscopy, debridement    No Known Allergies  History   Social History  . Marital Status: Married    Spouse Name: N/A    Number of Children: N/A  . Years of Education: N/A   Occupational History  . Not on file.   Social History Main Topics  . Smoking status: Former Games developer  . Smokeless tobacco: Not on file  . Alcohol Use: Not on file  . Drug Use: Not on file  . Sexually Active: Not on file   Other Topics Concern  . Not on file   Social History Narrative  . No narrative on file    Family History  Problem Relation Age of Onset  .  Sudden death Mother   . Sudden death Father   . Heart attack Father     BP 142/91  Pulse 94  Ht 5\' 3"  (1.6 m)  Wt 172 lb (78.019 kg)  BMI 30.48 kg/m2  Review of Systems  See HPI above.    Objective:   Physical Exam  Gen: NAD  CV: RRR no MRG. Lungs CTAB without wheezes, rales, rhonchi all lung fields. Chest: Mild swelling over left sternoclavicular, superior sternocostal region.  Tenderness here is greatest though mild tenderness pectoralis area, biceps tendon area.  No other TTP neck, chest, shoulders.     Assessment & Plan:  1. Left shoulder pain/chest pain - Discussed I am less convinced this is an orthopedic issue as her pain has worsened, not improved with PT, home exercises, pain medication.  She does not have signs of systemic disease other than the  pain in anterior chest that started with left shoulder pain.  I advised again following up with PCP for her opinion but advised we start with Chest x-ray - has some fluid in lingular area that I agree is likely atelectasis, not infectious.  CMP, CRP, ESR, CBC with diff drawn today.  Ordered CMP stat to get creatinine back in case we need to move forward with a contrast CT after ordering non-contrast CT of chest.  Her cancer screening is up to date, no personal history of cancer.  Also no personal h/o systemic diseases or IV drug use that would predispose her to infection in sternoclavicular joint (also not febrile).  Will await CT results.  Addendum 3/17:  CT reviewed, discussed with radiologist and the patient.  She has a lytic lesion in manubrium that is the cause of her pain.  In addition to this has probable mass in the lingular area initially seen on x-ray.  No mediastinal lymph node involvement.  After discussion with radiologist, will move forward with referral to IR to biopsy the manubrial mass as first step to try to identify tumor type before considering oncology, pulmonology referrals.  Addendum 3/24:  Discussed path report with radiologist then advised patient as well.  She has a metastatic adenocarcinoma with an unknown primary.  She does have a possible lung primary though notes indicate hepatobiliary would be most likely.  Regardless I advised her will refer to oncology for further workup which will likely include more imaging as the next step.  Printed out a prescription for oxycodone/acetaminophen 7.5/325 as not getting enough relief with norco.

## 2012-08-31 NOTE — Assessment & Plan Note (Signed)
Discussed I am less convinced this is an orthopedic issue as her pain has worsened, not improved with PT, home exercises, pain medication.  She does not have signs of systemic disease other than the pain in anterior chest that started with left shoulder pain.  I advised again following up with PCP for her opinion but advised we start with Chest x-ray - has some fluid in lingular area that I agree is likely atelectasis, not infectious.  CMP, CRP, ESR, CBC with diff drawn today.  Ordered CMP stat to get creatinine back in case we need to move forward with a contrast CT after ordering non-contrast CT of chest.  Her cancer screening is up to date, no personal history of cancer.  Also no personal h/o systemic diseases or IV drug use that would predispose her to infection in sternoclavicular joint (also not febrile).  Will await CT results.

## 2012-09-01 LAB — CBC WITH DIFFERENTIAL/PLATELET
Basophils Relative: 1 % (ref 0–1)
Eosinophils Absolute: 0.2 10*3/uL (ref 0.0–0.7)
Eosinophils Relative: 2 % (ref 0–5)
MCH: 28.5 pg (ref 26.0–34.0)
MCHC: 33.7 g/dL (ref 30.0–36.0)
MCV: 84.7 fL (ref 78.0–100.0)
Neutrophils Relative %: 72 % (ref 43–77)
Platelets: 244 10*3/uL (ref 150–400)
RDW: 13.2 % (ref 11.5–15.5)

## 2012-09-01 LAB — SEDIMENTATION RATE: Sed Rate: 22 mm/hr (ref 0–22)

## 2012-09-01 NOTE — Addendum Note (Signed)
Addended by: Lenda Kelp on: 09/01/2012 09:30 AM   Modules accepted: Orders

## 2012-09-02 ENCOUNTER — Other Ambulatory Visit: Payer: Self-pay | Admitting: Radiology

## 2012-09-02 ENCOUNTER — Ambulatory Visit: Payer: Medicare Other | Admitting: Rehabilitation

## 2012-09-02 ENCOUNTER — Other Ambulatory Visit: Payer: Self-pay | Admitting: Family Medicine

## 2012-09-02 DIAGNOSIS — R222 Localized swelling, mass and lump, trunk: Secondary | ICD-10-CM

## 2012-09-03 ENCOUNTER — Ambulatory Visit (HOSPITAL_COMMUNITY)
Admission: RE | Admit: 2012-09-03 | Discharge: 2012-09-03 | Disposition: A | Discharge status: Home / Self Care | Payer: Medicare Other | Source: Ambulatory Visit | Attending: Family Medicine | Admitting: Family Medicine

## 2012-09-03 DIAGNOSIS — R222 Localized swelling, mass and lump, trunk: Secondary | ICD-10-CM

## 2012-09-03 DIAGNOSIS — M899 Disorder of bone, unspecified: Secondary | ICD-10-CM | POA: Insufficient documentation

## 2012-09-03 DIAGNOSIS — J984 Other disorders of lung: Secondary | ICD-10-CM | POA: Insufficient documentation

## 2012-09-03 LAB — PROTIME-INR
INR: 0.95 (ref 0.00–1.49)
Prothrombin Time: 12.6 seconds (ref 11.6–15.2)

## 2012-09-03 LAB — CBC
MCH: 29.5 pg (ref 26.0–34.0)
MCHC: 34.4 g/dL (ref 30.0–36.0)
Platelets: 245 10*3/uL (ref 150–400)
RDW: 12.7 % (ref 11.5–15.5)

## 2012-09-03 LAB — APTT: aPTT: 31 seconds (ref 24–37)

## 2012-09-03 MED ORDER — MIDAZOLAM HCL 2 MG/2ML IJ SOLN
INTRAMUSCULAR | Status: AC | PRN
  Administered 2012-09-03: 1 mg via INTRAVENOUS

## 2012-09-03 MED ORDER — HYDROCODONE-ACETAMINOPHEN 5-325 MG PO TABS
1.0000 | ORAL_TABLET | ORAL | Status: DC | PRN
  Administered 2012-09-03 (×2): 1 via ORAL

## 2012-09-03 MED ORDER — FENTANYL CITRATE 0.05 MG/ML IJ SOLN
INTRAMUSCULAR | Status: AC
  Filled 2012-09-03: qty 2

## 2012-09-03 MED ORDER — HYDROCODONE-ACETAMINOPHEN 5-325 MG PO TABS
ORAL_TABLET | ORAL | Status: AC
  Filled 2012-09-03: qty 1

## 2012-09-03 MED ORDER — MIDAZOLAM HCL 2 MG/2ML IJ SOLN
INTRAMUSCULAR | Status: AC
  Filled 2012-09-03: qty 4

## 2012-09-03 MED ORDER — SODIUM CHLORIDE 0.9 % IV SOLN
INTRAVENOUS | Status: DC
  Administered 2012-09-03: 10:00:00 via INTRAVENOUS

## 2012-09-03 MED ORDER — FENTANYL CITRATE 0.05 MG/ML IJ SOLN
INTRAMUSCULAR | Status: AC | PRN
  Administered 2012-09-03: 50 ug via INTRAVENOUS
  Administered 2012-09-03 (×2): 25 ug via INTRAVENOUS

## 2012-09-03 NOTE — ED Notes (Signed)
Dr Grace Isaac at [facility], d/w pt and spouse POC, will obtain arm sling for pt comfort

## 2012-09-03 NOTE — ED Notes (Signed)
Short stay notified for bed 

## 2012-09-03 NOTE — Procedures (Signed)
Technically successful CT guided biopsy of lytic lesion involving the manubrium.   No immediate post procedural complications.

## 2012-09-03 NOTE — H&P (Signed)
[name] Pacheco is an 67 y.o. female.   Chief Complaint: "I'm here for a biopsy of my chest" HPI: Patient with history of persistent left shoulder/anterior chest wall pain and recent CT revealing a lytic lesion of the manubrium, left lung mass and scattered pulmonary lesions presents today for CT guided manubrial lesion biopsy.  Past Medical History  Diagnosis Date  . Hypertension   . Hyperlipidemia   . Parkinson disease     Past Surgical History  Procedure Laterality Date  . Tonsillectomy    . Shoulder surgery Left 2012    arthroscopy, debridement    Family History  Problem Relation Age of Onset  . Sudden death Mother   . Sudden death Father   . Heart attack Father    Social History:  reports that she has quit smoking. She does not have any smokeless tobacco history on file. Her alcohol and drug histories are not on file.  Allergies: No Known Allergies  Current outpatient prescriptions:aspirin 81 MG tablet, Take 81 mg by mouth daily., Disp: , Rfl: ;  Calcium Carbonate-Vitamin D (CALTRATE 600+D PO), Take 1 tablet by mouth 2 (two) times daily., Disp: , Rfl: ;  Cholecalciferol (VITAMIN D) 2000 UNITS CAPS, Take 1 capsule by mouth daily., Disp: , Rfl: ;  cyclobenzaprine (FLEXERIL) 5 MG tablet, Take 1 tablet (5 mg total) by mouth every 8 (eight) hours as needed for muscle spasms., Disp: 90 tablet, Rfl: 0 ezetimibe (ZETIA) 10 MG tablet, Take 10 mg by mouth daily., Disp: , Rfl: ;  HYDROcodone-acetaminophen (NORCO) 5-325 MG per tablet, Take 1 tablet by mouth every 6 (six) hours as needed for pain., Disp: 60 tablet, Rfl: 0;  metoprolol succinate (TOPROL-XL) 25 MG 24 hr tablet, Take 25 mg by mouth daily. , Disp: , Rfl: ;  Multiple Vitamins-Minerals (CENTRUM PO), Take 1 tablet by mouth daily., Disp: , Rfl:  Nutritional Supplements (ESTROVEN PO), Take 1 tablet by mouth daily., Disp: , Rfl: ;  omeprazole (PRILOSEC) 10 MG capsule, Take 10 mg by mouth daily., Disp: , Rfl: ;  pramipexole (MIRAPEX) 1 MG  tablet, Take 3.5 mg by mouth daily. Dosage 1mg . Takes 3 1/2 tablets daily, Disp: , Rfl: ;  simvastatin (ZOCOR) 20 MG tablet, Take 20 mg by mouth daily., Disp: , Rfl:  zolpidem (AMBIEN CR) 6.25 MG CR tablet, Take 6.25 mg by mouth at bedtime as needed. For sleep, Disp: , Rfl:  Current facility-administered medications:0.9 %  sodium chloride infusion, , Intravenous, Continuous, Brayton El, PA-C  09/03/12 labs pending   Review of Systems  Constitutional: Negative for fever and chills.  Respiratory: Negative for shortness of breath.        Occ cough  Cardiovascular: Positive for chest pain.  Gastrointestinal: Negative for nausea, vomiting and abdominal pain.  Musculoskeletal:       Left arm/shoulder pain with radiation to post neck  Neurological: Positive for headaches.  Endo/Heme/Allergies: Does not bruise/bleed easily.   There were no vitals filed for this visit.  Physical Exam  Constitutional: She is oriented to person, place, and time. She appears well-developed and well-nourished.  Cardiovascular: Normal rate and regular rhythm.   Respiratory: Effort normal and breath sounds normal.  GI: Soft. Bowel sounds are normal. There is no tenderness.  Musculoskeletal: She exhibits no edema.  Sl decreased ROM left arm secondary to shoulder pain  Neurological: She is alert and oriented to person, place, and time.     Assessment/Plan Pt with remote history of smoking, left shoulder/arm/ant chest  wall pain and evidence of lytic lesion of manubrium, left lung mass and scattered pulmonary lesions on recent CT. Plan is for CT guided biopsy of the lytic manubrial lesion today. Details/risks of procedure d/w pt/husband with their understanding and consent.  ALLRED,D KEVIN 09/03/2012, 9:57 AM

## 2012-09-03 NOTE — Progress Notes (Signed)
Orthopedic Tech Progress Note Patient Details:  [name] Pacheco 03-May-1946 [id number] Sling immobilizer application walked thru with patient and husband in radiology dept. Patient to wear immobilizer for comfort.   Ortho Devices Type of Ortho Device: Sling immobilizer Ortho Device/Splint Interventions: Application   Asia R Thompson 09/03/2012, 2:02 PM

## 2012-09-07 ENCOUNTER — Other Ambulatory Visit: Payer: Self-pay | Admitting: *Deleted

## 2012-09-07 ENCOUNTER — Telehealth: Payer: Self-pay | Admitting: Hematology & Oncology

## 2012-09-07 DIAGNOSIS — C801 Malignant (primary) neoplasm, unspecified: Secondary | ICD-10-CM

## 2012-09-07 DIAGNOSIS — C7952 Secondary malignant neoplasm of bone marrow: Secondary | ICD-10-CM

## 2012-09-07 DIAGNOSIS — C7951 Secondary malignant neoplasm of bone: Secondary | ICD-10-CM

## 2012-09-07 MED ORDER — OXYCODONE-ACETAMINOPHEN 7.5-325 MG PO TABS: 1.0000 | ORAL_TABLET | Freq: Four times a day (QID) | ORAL | Status: DC | PRN

## 2012-09-07 NOTE — Telephone Encounter (Signed)
RN will let Pt know about 3-26 PET, to be NPO 6 hours prior and 3-27 appointment with Dr. Myna Hidalgo

## 2012-09-07 NOTE — Addendum Note (Signed)
Addended by: Lenda Kelp on: 09/07/2012 02:24 PM   Modules accepted: Orders, Medications

## 2012-09-09 ENCOUNTER — Encounter: Payer: Medicare Other | Admitting: Rehabilitation

## 2012-09-09 ENCOUNTER — Encounter (HOSPITAL_BASED_OUTPATIENT_CLINIC_OR_DEPARTMENT_OTHER)
Admission: RE | Admit: 2012-09-09 | Discharge: 2012-09-09 | Disposition: A | Discharge status: Home / Self Care | Payer: Medicare Other | Source: Ambulatory Visit | Attending: Hematology & Oncology | Admitting: Hematology & Oncology

## 2012-09-09 DIAGNOSIS — C801 Malignant (primary) neoplasm, unspecified: Secondary | ICD-10-CM | POA: Insufficient documentation

## 2012-09-09 DIAGNOSIS — C7951 Secondary malignant neoplasm of bone: Secondary | ICD-10-CM | POA: Insufficient documentation

## 2012-09-09 DIAGNOSIS — C7952 Secondary malignant neoplasm of bone marrow: Secondary | ICD-10-CM

## 2012-09-09 MED ORDER — FLUDEOXYGLUCOSE F - 18 (FDG) INJECTION: 9.3300 | Freq: Once | INTRAVENOUS | Status: AC | PRN

## 2012-09-10 ENCOUNTER — Encounter: Payer: Self-pay | Admitting: Hematology & Oncology

## 2012-09-10 ENCOUNTER — Ambulatory Visit (HOSPITAL_BASED_OUTPATIENT_CLINIC_OR_DEPARTMENT_OTHER): Payer: Medicare Other | Admitting: Hematology & Oncology

## 2012-09-10 ENCOUNTER — Ambulatory Visit (HOSPITAL_BASED_OUTPATIENT_CLINIC_OR_DEPARTMENT_OTHER): Payer: Medicare Other

## 2012-09-10 ENCOUNTER — Ambulatory Visit (HOSPITAL_BASED_OUTPATIENT_CLINIC_OR_DEPARTMENT_OTHER): Payer: Medicare Other | Admitting: Lab

## 2012-09-10 ENCOUNTER — Ambulatory Visit: Payer: Medicare Other

## 2012-09-10 DIAGNOSIS — C3491 Malignant neoplasm of unspecified part of right bronchus or lung: Secondary | ICD-10-CM

## 2012-09-10 DIAGNOSIS — R222 Localized swelling, mass and lump, trunk: Secondary | ICD-10-CM

## 2012-09-10 DIAGNOSIS — D492 Neoplasm of unspecified behavior of bone, soft tissue, and skin: Secondary | ICD-10-CM

## 2012-09-10 DIAGNOSIS — C7802 Secondary malignant neoplasm of left lung: Secondary | ICD-10-CM

## 2012-09-10 DIAGNOSIS — C7800 Secondary malignant neoplasm of unspecified lung: Secondary | ICD-10-CM

## 2012-09-10 HISTORY — DX: Secondary malignant neoplasm of unspecified lung: C78.00

## 2012-09-10 LAB — CBC WITH DIFFERENTIAL (CANCER CENTER ONLY)
BASO%: 0.6 % (ref 0.0–2.0)
LYMPH%: 22.2 % (ref 14.0–48.0)
MCH: 28.7 pg (ref 26.0–34.0)
MCV: 88 fL (ref 81–101)
MONO%: 11.4 % (ref 0.0–13.0)
Platelets: 302 10*3/uL (ref 145–400)
RDW: 12.7 % (ref 11.1–15.7)

## 2012-09-10 LAB — PREALBUMIN: Prealbumin: 19.2 mg/dL (ref 17.0–34.0)

## 2012-09-10 LAB — COMPREHENSIVE METABOLIC PANEL
ALT: 37 U/L — ABNORMAL HIGH (ref 0–35)
Alkaline Phosphatase: 97 U/L (ref 39–117)
Glucose, Bld: 107 mg/dL — ABNORMAL HIGH (ref 70–99)
Sodium: 140 mEq/L (ref 135–145)
Total Bilirubin: 0.2 mg/dL — ABNORMAL LOW (ref 0.3–1.2)
Total Protein: 6.4 g/dL (ref 6.0–8.3)

## 2012-09-10 MED ORDER — OXYCODONE-ACETAMINOPHEN 5-325 MG PO TABS
1.0000 | ORAL_TABLET | Freq: Once | ORAL | Status: AC
  Administered 2012-09-10: 1 via ORAL

## 2012-09-10 MED ORDER — SODIUM CHLORIDE 0.9 % IV SOLN
Freq: Once | INTRAVENOUS | Status: AC
  Administered 2012-09-10: 13:00:00 via INTRAVENOUS

## 2012-09-10 MED ORDER — DEXAMETHASONE 4 MG PO TABS: ORAL_TABLET | ORAL | Status: DC

## 2012-09-10 MED ORDER — OXYCODONE-ACETAMINOPHEN 5-325 MG PO TABS: 1.0000 | ORAL_TABLET | Freq: Once | ORAL | Status: DC

## 2012-09-10 MED ORDER — ZOLEDRONIC ACID 4 MG/100ML IV SOLN
4.0000 mg | Freq: Once | INTRAVENOUS | Status: AC
  Administered 2012-09-10: 4 mg via INTRAVENOUS
  Filled 2012-09-10: qty 100

## 2012-09-10 NOTE — Patient Instructions (Signed)
Zoledronic Acid injection (Hypercalcemia, Oncology) What is this medicine? ZOLEDRONIC ACID (ZOE le dron ik AS id) lowers the amount of calcium loss from bone. It is used to treat too much calcium in your blood from cancer. It is also used to prevent complications of cancer that has spread to the bone. This medicine may be used for other purposes; ask your health care provider or pharmacist if you have questions. What should I tell my health care provider before I take this medicine? They need to know if you have any of these conditions: -aspirin-sensitive asthma -dental disease -kidney disease -an unusual or allergic reaction to zoledronic acid, other medicines, foods, dyes, or preservatives -pregnant or trying to get pregnant -breast-feeding How should I use this medicine? This medicine is for infusion into a vein. It is given by a health care professional in a hospital or clinic setting. Talk to your pediatrician regarding the use of this medicine in children. Special care may be needed. Overdosage: If you think you have taken too much of this medicine contact a poison control center or emergency room at once. NOTE: This medicine is only for you. Do not share this medicine with others. What if I miss a dose? It is important not to miss your dose. Call your doctor or health care professional if you are unable to keep an appointment. What may interact with this medicine? -certain antibiotics given by injection -NSAIDs, medicines for pain and inflammation, like ibuprofen or naproxen -some diuretics like bumetanide, furosemide -teriparatide -thalidomide This list may not describe all possible interactions. Give your health care provider a list of all the medicines, herbs, non-prescription drugs, or dietary supplements you use. Also tell them if you smoke, drink alcohol, or use illegal drugs. Some items may interact with your medicine. What should I watch for while using this medicine? Visit  your doctor or health care professional for regular checkups. It may be some time before you see the benefit from this medicine. Do not stop taking your medicine unless your doctor tells you to. Your doctor may order blood tests or other tests to see how you are doing. Women should inform their doctor if they wish to become pregnant or think they might be pregnant. There is a potential for serious side effects to an unborn child. Talk to your health care professional or pharmacist for more information. You should make sure that you get enough calcium and vitamin D while you are taking this medicine. Discuss the foods you eat and the vitamins you take with your health care professional. Some people who take this medicine have severe bone, joint, and/or muscle pain. This medicine may also increase your risk for a broken thigh bone. Tell your doctor right away if you have pain in your upper leg or groin. Tell your doctor if you have any pain that does not go away or that gets worse. What side effects may I notice from receiving this medicine? Side effects that you should report to your doctor or health care professional as soon as possible: -allergic reactions like skin rash, itching or hives, swelling of the face, lips, or tongue -anxiety, confusion, or depression -breathing problems -changes in vision -feeling faint or lightheaded, falls -jaw burning, cramping, pain -muscle cramps, stiffness, or weakness -trouble passing urine or change in the amount of urine Side effects that usually do not require medical attention (report to your doctor or health care professional if they continue or are bothersome): -bone, joint, or muscle pain -  fever -hair loss -irritation at site where injected -loss of appetite -nausea, vomiting -stomach upset -tired This list may not describe all possible side effects. Call your doctor for medical advice about side effects. You may report side effects to FDA at  1-800-FDA-1088. Where should I keep my medicine? This drug is given in a hospital or clinic and will not be stored at home. NOTE: This sheet is a summary. It may not cover all possible information. If you have questions about this medicine, talk to your doctor, pharmacist, or health care provider.  2012, [employer]. (11/30/2010 9:06:58 AM) 

## 2012-09-10 NOTE — Progress Notes (Signed)
CC:   Norton Blizzard, M.D.  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  HISTORY OF PRESENT ILLNESS:  [name] is a very nice 67 year old white female.  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  She knows my wife from working over at [facility] several years ago.  She has been in good health.  She has had her routine yearly mammograms. Her last colonoscopy was probably about 5 years ago.  She denies any change in bowel or bladder habits.  She began to have some pain in the left shoulder.  There is no weakness. There is no pain in the arm.  She had no cough or hemoptysis.  There is no weight loss or weight gain. She had no fevers.  She then began to have some discomfort in her back.  She then noted a swelling in the sternum.  This was while she was taking some physical therapy.  She again saw Dr. Pearletha Forge.  He was incredibly astute and ordered a chest x-ray.  This suggested a lingular opacity.  He then went ahead and ordered a CT scan of her chest.  The CT scan, unfortunately, showed a lytic lesion in the manubrium.  There was extension of this lesion to the adjacent pectoralis muscle.  The lesion probably measured 2.5 x 3.6 x 2.4 cm.  She was noted to have a left lower lobe lesion measuring 4.8 x 3.8 x 3.4 cm.  There were some pulmonary nodules bilaterally.  No mediastinal adenopathy was appreciated.  There was no pericardial effusion or thickening.  Finally, she had a PET scan done.  The PET scan was done on __________ the 26th.  The PET scan showed marked hypermetabolic activity in the sternum and in the left lower lobe mass.  No findings were noted elsewhere.  She did undergo a biopsy of the sternal lesion.  This was done on 09/03/2012.  The pathology report ([id number]) showed metastatic adenocarcinoma.  This was nonspecific with staining.  It was felt to be a lung, breast, or gynecologic source.  She then was kindly referred to [facility] for an evaluation.  She is taking some Percocet to help with the discomfort.  She has not noted any bleeding.  There has been no cough.  Her appetite is doing okay right now.  Overall, her performance status is ECOG 1.  PAST MEDICAL HISTORY: 1. Hypertension. 2. Hyperlipidemia. 3. Parkinson's.  ALLERGIES:  None.  MEDICATIONS: 1. Aspirin 81 mg p.o. daily. 2. Flexeril 5 mg p.o. q.8 hours p.r.n. 3. Zetia 10 mg p.o. daily. 4. Percocet as needed. 5. Toprol-XL 25 mg p.o. daily. 6. Prilosec 10 mg p.o. daily. 7. Mirapex 3.5 mg p.o. daily. 8. Zocor 20 mg p.o. daily. 9. Ambien CR 6.25 mg p.o. at bedtime p.r.n.  SOCIAL HISTORY:  She smoked a long time ago.  She probably stopped smoking over 20 years ago.  She probably had less than a 10-pack-year history of tobacco use.  There may be some secondhand smoke from her parents.  There are no obvious occupational exposures.  FAMILY HISTORY:  Her family history is really not too well known.  She grew up in a children's home in [place].  REVIEW OF SYSTEMS:  As stated in History of Present Illness.  She does have pain in her left shoulder.  She has referred pain to her back. There is no headache.  No double vision or blurred vision.  There is no dysphagia or odynophagia.  She has had no cough.  There is no hemoptysis.  There is no change in bowel or bladder habits.  There is no dysuria or hematuria.  There is no leg swelling.  There is no leg pain.  PHYSICAL EXAMINATION:  General:  This is a well-developed, well- nourished white female in no obvious distress.  Vital signs: Temperature of 97.9, pulse 71, respiratory rate 16, blood pressure 134/81.  Weight is 174.  Head and neck:  Normocephalic, atraumatic skull.  She has no ocular or oral lesions.  There are no palpable cervical or supraclavicular lymph nodes.  Lungs:  Clear to percussion and auscultation bilaterally.  Cardiac:  Regular rate and rhythm, with  a normal S1 and S2.  There are no murmurs, rubs or bruits.  Abdomen: Soft, with good bowel sounds.  There is no palpable abdominal mass. There is no fluid wave.  There is no palpable hepatosplenomegaly.  Back: Shows no tenderness over the spine, ribs, or hips.  Extremities:  Show her left arm in a shoulder harness.  She has limited mobility of the left shoulder.  The right arm and shoulder have good range of motion and strength.  She has good strength in her legs.  Chest wall:  The chest wall exam shows some swelling in the sternal region about the manubrium. Neurological:  Shows no focal neurological deficits.  LABORATORY STUDIES:  White cell count is 8.2, hemoglobin 12.4, hematocrit 38, platelet count is 302,000.  MCV is 88.  IMPRESSION:  [name] is a 67 year old white female with what appears to be metastatic adenocarcinoma.  I would have to say that this is a lung primary.  She has the left lower lung pleural-based mass.  I think the real key now is going to be to determine if she has a genetic mutation with EGFR or ALK.  I think we are going to have to biopsy the lung primary.  I spoke with Dr. Fredia Sorrow of Interventional Radiology.  He will get this done next week.  I think we are going to need to do radiation for the sternal lesion.  I spoke with Dr. __________ Doylene Canard of Radiation Oncology.  He will see her and get her set up for radiation for palliative purposes.  I believe we should give her a trial of steroids to try to help with some of the sternal pain.  I wrote her out a prescription for Decadron at 8 mg 4 times a day, and then we can start tapering as needed.  I told her to make sure that she takes the Decadron with food.  I also want to get her on Zometa.  She clearly is a candidate for Zometa, given that she has a sternal lesion, and I want to try to prevent this from fracturing.  Again, treatment is really going to be based upon the biopsy of the lung primary  and the genetic profiling.  Hopefully, she will have a mutation for either EGFR or ALK.  We also can check for ROS.  I spent a good hour-and-a-half with Ms. Tritch and her husband.  I answered all their questions.  I gave Ms. Heilman a prayer blanket.  She was very appreciative of this.  As far as followup with me, I really want to see what the biopsy of the lung primary shows and the genetic studies that will be done.  If we do confirm this as a lung primary, she will need to have an MRI of the brain.  ______________________________ Josph Macho, M.D. PRE/MEDQ  D:  09/09/2012  T:  09/10/2012  Job:  [id number]

## 2012-09-10 NOTE — Patient Instructions (Addendum)
Your diagnosis is probable metastatic lung cancer.  Will need to have another biopsy. This time of the left lung lesion to do genetic studies. Need to determine the cell type/characteristics.   To begin:   Radiation to the sternal (breast bone lesion)  Decadron (dexamethasone) pills to help the chest pain/discomfort by decreasing inflammation.  Zometa IV infusion every month to help with the bone lesion. (see info below)  Thank you for entrusting [place] to take care of you during this time.  Isaiah 41:10   Zoledronic Acid injection  What is this medicine?  ZOLEDRONIC ACID (ZOE le dron ik AS id) lowers the amount of calcium loss from bone. It is used to treat too much calcium in your blood from cancer. It is also used to prevent complications of cancer that has spread to the bone.  This medicine may be used for other purposes; ask your health care provider or pharmacist if you have questions.  What should I tell my health care provider before I take this medicine?  They need to know if you have any of these conditions: -aspirin-sensitive asthma -dental disease -kidney disease -an unusual or allergic reaction to zoledronic acid, other medicines, foods, dyes, or preservatives -pregnant or trying to get pregnant -breast-feeding  How should I use this medicine?  This medicine is for infusion into a vein. It is given by a health care professional in a hospital or clinic setting. Talk to your pediatrician regarding the use of this medicine in children. Special care may be needed. Overdosage: If you think you have taken too much of this medicine contact a poison control center or emergency room at once. NOTE: This medicine is only for you. Do not share this medicine with others.  What if I miss a dose?  It is important not to miss your dose. Call your doctor or health care professional if you are unable to keep an appointment.  What may interact with this medicine?  -certain  antibiotics given by injection -NSAIDs, medicines for pain and inflammation, like ibuprofen or naproxen -some diuretics like bumetanide, furosemide -teriparatide -thalidomide This list may not describe all possible interactions. Give your health care provider a list of all the medicines, herbs, non-prescription drugs, or dietary supplements you use. Also tell them if you smoke, drink alcohol, or use illegal drugs. Some items may interact with your medicine.  What should I watch for while using this medicine?  Visit your doctor or health care professional for regular checkups. It may be some time before you see the benefit from this medicine. Do not stop taking your medicine unless your doctor tells you to. Your doctor may order blood tests or other tests to see how you are doing. Women should inform their doctor if they wish to become pregnant or think they might be pregnant. There is a potential for serious side effects to an unborn child. Talk to your health care professional or pharmacist for more information. You should make sure that you get enough calcium and vitamin D while you are taking this medicine. Discuss the foods you eat and the vitamins you take with your health care professional. Some people who take this medicine have severe bone, joint, and/or muscle pain. This medicine may also increase your risk for a broken thigh bone. Tell your doctor right away if you have pain in your upper leg or groin. Tell your doctor if you have any pain that does not go away or that gets worse.  What  side effects may I notice from receiving this medicine?  Side effects that you should report to your doctor or health care professional as soon as possible: -allergic reactions like skin rash, itching or hives, swelling of the face, lips, or tongue -anxiety, confusion, or depression -breathing problems -changes in vision -feeling faint or lightheaded, falls -jaw burning, cramping, pain -muscle cramps,  stiffness, or weakness -trouble passing urine or change in the amount of urine Side effects that usually do not require medical attention (report to your doctor or health care professional if they continue or are bothersome): -bone, joint, or muscle pain -fever -hair loss -irritation at site where injected -loss of appetite -nausea, vomiting -stomach upset -tired This list may not describe all possible side effects. Call your doctor for medical advice about side effects. You may report side effects to FDA at 1-800-FDA-1088.    NOTE: This sheet is a summary. It may not cover all possible information. If you have questions about this medicine, talk to your doctor, pharmacist, or health care provider.  2013, [employer]. (11/30/2010 9:06:58 AM)

## 2012-09-10 NOTE — Progress Notes (Signed)
This office note has been dictated.

## 2012-09-14 ENCOUNTER — Telehealth: Payer: Self-pay | Admitting: Hematology & Oncology

## 2012-09-14 NOTE — Telephone Encounter (Signed)
[name] Pacheco from scheduleing called  Will let pt know about 4-3 biopsy, [name] at 11 am MD aware

## 2012-09-14 NOTE — Telephone Encounter (Signed)
Left message asking her if she precerted biopsies and to call me

## 2012-09-14 NOTE — Telephone Encounter (Signed)
Central Scheduling aware of biopsy for pt.

## 2012-09-15 ENCOUNTER — Encounter: Payer: Self-pay | Admitting: Radiation Oncology

## 2012-09-15 ENCOUNTER — Encounter (HOSPITAL_COMMUNITY): Payer: Self-pay | Admitting: Pharmacy Technician

## 2012-09-15 NOTE — Progress Notes (Signed)
67 year old female. Married. Grew up in a children's home in [place].   Referred by Dr. Colin Benton for consideration of palliative radiation therapy to sternal lesion. Suspect left lower lobe  lung primary with metastasis manubrium and adjacent pectoralis muscle. Some bilateral pulmonary nodules noted. CT guided lung biopsy scheduled for 09/17/2012. Also, [facility] wanted MRI of the patient's brain.  NKDA No indication of a pacemaker Denies hx of radiation therapy

## 2012-09-16 ENCOUNTER — Other Ambulatory Visit: Payer: Self-pay | Admitting: Radiology

## 2012-09-16 ENCOUNTER — Ambulatory Visit
Admission: RE | Admit: 2012-09-16 | Discharge: 2012-09-16 | Disposition: A | Discharge status: Home / Self Care | Payer: Medicare Other | Source: Ambulatory Visit | Attending: Radiation Oncology | Admitting: Radiation Oncology

## 2012-09-16 ENCOUNTER — Encounter: Payer: Medicare Other | Admitting: Rehabilitation

## 2012-09-16 ENCOUNTER — Other Ambulatory Visit: Payer: Self-pay | Admitting: *Deleted

## 2012-09-16 ENCOUNTER — Encounter: Payer: Self-pay | Admitting: Radiation Oncology

## 2012-09-16 VITALS — BP 153/93 | HR 64 | Temp 97.4°F | Resp 16 | Ht 63.0 in | Wt 168.0 lb

## 2012-09-16 DIAGNOSIS — R079 Chest pain, unspecified: Secondary | ICD-10-CM | POA: Insufficient documentation

## 2012-09-16 DIAGNOSIS — M25519 Pain in unspecified shoulder: Secondary | ICD-10-CM | POA: Insufficient documentation

## 2012-09-16 DIAGNOSIS — G4700 Insomnia, unspecified: Secondary | ICD-10-CM | POA: Insufficient documentation

## 2012-09-16 DIAGNOSIS — R0789 Other chest pain: Secondary | ICD-10-CM | POA: Insufficient documentation

## 2012-09-16 DIAGNOSIS — K5900 Constipation, unspecified: Secondary | ICD-10-CM | POA: Insufficient documentation

## 2012-09-16 DIAGNOSIS — R911 Solitary pulmonary nodule: Secondary | ICD-10-CM | POA: Insufficient documentation

## 2012-09-16 DIAGNOSIS — G893 Neoplasm related pain (acute) (chronic): Secondary | ICD-10-CM

## 2012-09-16 DIAGNOSIS — M542 Cervicalgia: Secondary | ICD-10-CM | POA: Insufficient documentation

## 2012-09-16 DIAGNOSIS — C7802 Secondary malignant neoplasm of left lung: Secondary | ICD-10-CM

## 2012-09-16 DIAGNOSIS — G20A1 Parkinson's disease without dyskinesia, without mention of fluctuations: Secondary | ICD-10-CM | POA: Insufficient documentation

## 2012-09-16 DIAGNOSIS — C7951 Secondary malignant neoplasm of bone: Secondary | ICD-10-CM | POA: Insufficient documentation

## 2012-09-16 DIAGNOSIS — C3490 Malignant neoplasm of unspecified part of unspecified bronchus or lung: Secondary | ICD-10-CM | POA: Insufficient documentation

## 2012-09-16 DIAGNOSIS — C7800 Secondary malignant neoplasm of unspecified lung: Secondary | ICD-10-CM

## 2012-09-16 DIAGNOSIS — G20 Parkinson's disease: Secondary | ICD-10-CM | POA: Insufficient documentation

## 2012-09-16 MED ORDER — OXYCODONE HCL 5 MG PO TABS: 5.0000 mg | ORAL_TABLET | ORAL | Status: DC | PRN

## 2012-09-16 MED ORDER — OXYCODONE HCL 10 MG PO TB12: 10.0000 mg | ORAL_TABLET | Freq: Two times a day (BID) | ORAL | Status: DC

## 2012-09-16 NOTE — Telephone Encounter (Signed)
Pt called asking if her pain medication could be refilled today or tomorrow as she is going to be out soon. Inquired further about her pain control. She is taking it every 6 hours around the clock with some relief but feels as though it could improve. She has upcoming biopsies and radiation. Discussed trying long acting pain medication and she was interested in trying this. Reviewed with Dr Myna Hidalgo. To start Oxycontin 10 mg Q12h with Oxy-IR 5-10 mg for breakthrough every 4 hours.

## 2012-09-16 NOTE — Progress Notes (Signed)
Patient presents to the clinic today accompanied by her husband for consultation with Dr. Roselind Messier to discuss the role of radiation therapy in the treatment of assume lung primary ca with sternal lesion. Patient is alert and oriented to person, place, and time. No distress noted. Steady gait noted. Pleasant affect noted. Patient reports left upper chest and cervical neck pain 7 on a scale of 0-10 despite taking percocet 7.5/325 mg tablet around lunchtime. Left shoulder pain increases with movement. Patient describes left upper chest pain as a feeling of someone sticking a knife in her left upper back and pushing it through her chest. Patient reports this pain is almost constant. Patient reports that today Dr. Colin Benton changed her medication from percocet to oxycontin 10 mg every 12 hours and oxy IR 5 mg every four hours. Visible sternal nodule noted. Patient reports nodule has been present for two weeks and has "grown since biopsy." patient scheduled for CT guided lung biopsy tomorrow. Patient denies cough or shortness of breath. Patient reports a gravely voice. Patient denies painful or difficult swallowing. Patient reports a good appetite and that she eats four small frequent meals per day. Patient taking decadron 8 mg qid. Patient denies dizziness or headache but, does reports cervical neck tension. Patient denies diarrhea or night sweats. Patient reports taking miralax and colace for constipation. Patient reports a long history of inability to sleep well due to parkinsons. Reported all findings to Dr. Roselind Messier.

## 2012-09-16 NOTE — Progress Notes (Signed)
Radiation Oncology         [phone] ________________________________  Initial outpatient Consultation  Name: [name] Pacheco MRN: [id number]  Date: 09/16/2012  DOB: 12-26-1945  CC:Pcp Not In System  Ennever, Rose Phi, MD   REFERRING PHYSICIAN: Josph Macho, MD  DIAGNOSIS: The encounter diagnosis was Metastatic lung carcinoma, left.  HISTORY OF PRESENT ILLNESS::[name] Pacheco is a 67 y.o. female who is seen out of the courtesy of Dr. Arlan Organ for an opinion concerning radiation therapy as part of the management of patient's recently diagnosed stage IV non-small cell lung cancer.  The patient presented with a rapidly growing mass along the upper sternum area. She also experienced pain along the left shoulder area radiating into the neck into the lower chest region. Imaging as documented below showed a large mass along the sternum with associated osseous destruction. In addition there was a lesion within the medial basal segment of the left lower lobe suspicious for bronchogenic carcinoma. She did undergo biopsy of her sternal mass revealing adenocarcinoma. She is scheduled for a biopsy of her lung mass tomorrow. In light of the patient's pain and x-ray findings she is now seen in radiation oncology to be considered for palliative treatment.   PREVIOUS RADIATION THERAPY: No  PAST MEDICAL HISTORY:  has a past medical history of Hypertension; Hyperlipidemia; Parkinson disease; and Metastatic lung carcinoma (09/10/2012).    PAST SURGICAL HISTORY: Past Surgical History  Procedure Laterality Date  . Tonsillectomy    . Shoulder surgery Left 2012    arthroscopy, debridement    FAMILY HISTORY: family history includes Heart attack in her father and Sudden death in her father and mother.  SOCIAL HISTORY:  reports that she quit smoking about 20 years ago. Her smoking use included Cigarettes. She smoked 0.00 packs per day for 10 years. She has never used smokeless tobacco. She reports  that she does not drink alcohol or use illicit drugs.  ALLERGIES: Review of patient's allergies indicates no known allergies.  MEDICATIONS:  Current Outpatient Prescriptions  Medication Sig Dispense Refill  . Calcium Carbonate-Vitamin D (CALTRATE 600+D PO) Take 1 tablet by mouth 2 (two) times daily.      . Cholecalciferol (VITAMIN D) 2000 UNITS CAPS Take 1 capsule by mouth daily.      . cyclobenzaprine (FLEXERIL) 5 MG tablet Take 5 mg by mouth every 8 (eight) hours as needed for muscle spasms.      Marland Kitchen dexamethasone (DECADRON) 4 MG tablet Take 8 mg by mouth 4 (four) times daily.      Marland Kitchen ezetimibe (ZETIA) 10 MG tablet Take 10 mg by mouth every evening.      . metoprolol succinate (TOPROL-XL) 25 MG 24 hr tablet Take 25 mg by mouth daily.      . Multiple Vitamin (MULTIVITAMIN WITH MINERALS) TABS Take 1 tablet by mouth daily.      . Nutritional Supplements (ESTROVEN PO) Take 1 tablet by mouth daily.      Marland Kitchen omeprazole (PRILOSEC) 10 MG capsule Take 10 mg by mouth daily.      Marland Kitchen oxyCODONE (OXY IR/ROXICODONE) 5 MG immediate release tablet Take 1-2 tablets (5-10 mg total) by mouth every 4 (four) hours as needed for pain.  120 tablet  0  . oxyCODONE (OXYCONTIN) 10 MG 12 hr tablet Take 1 tablet (10 mg total) by mouth every 12 (twelve) hours.  60 tablet  0  . oxyCODONE-acetaminophen (PERCOCET) 7.5-325 MG per tablet Take 1 tablet by mouth every 6 (  six) hours as needed for pain.      . pramipexole (MIRAPEX) 1 MG tablet Take 1-1.5 mg by mouth 3 (three) times daily.      . simvastatin (ZOCOR) 20 MG tablet Take 20 mg by mouth at bedtime.      Marland Kitchen zolpidem (AMBIEN CR) 6.25 MG CR tablet Take 6.25 mg by mouth at bedtime.       No current facility-administered medications for this encounter.    REVIEW OF SYSTEMS:  A 15 point review of systems is documented in the electronic medical record. This was obtained by the nursing staff. However, I reviewed this with the patient to discuss relevant findings and make  appropriate changes.  Pain in the upper chest and left shoulder region. She does have limitation of range of motion in her left arm and shoulder in light of her pain. She has migratory pain along the left chest and upper back region. Her pain is but radiation to her neck region and left posterior chest region. She denies any headaches or visual problems. She denies any cough or breathing problems. She denies any hemoptysis.   PHYSICAL EXAM:  height is 5\' 3"  (1.6 m) and weight is 168 lb (76.204 kg). Her oral temperature is 97.4 F (36.3 C). Her blood pressure is 153/93 and her pulse is 64. Her respiration is 16 and oxygen saturation is 100%.   BP 153/93  Pulse 64  Temp(Src) 97.4 F (36.3 C) (Oral)  Resp 16  Ht 5\' 3"  (1.6 m)  Wt 168 lb (76.204 kg)  BMI 29.77 kg/m2  SpO2 100%  General Appearance:    Alert, cooperative, no distress, appears stated age  Head:    Normocephalic, without obvious abnormality, atraumatic  Eyes:    PERRL, conjunctiva/corneas clear, EOM's intact,    Ears:    Normal TM's and external ear canals, both ears  Nose:   Nares normal, septum midline, mucosa normal, no drainage    or sinus tenderness  Throat:   Lips, mucosa, and tongue normal; teeth and gums normal  Neck:   Supple, symmetrical, trachea midline, no adenopathy;    thyroid:  no enlargement/tenderness/nodules; no carotid   bruit or JVD  Back:     Symmetric, no curvature, ROM normal, no CVA tenderness  Lungs:     Clear to auscultation bilaterally, respirations unlabored  Chest Wall:    large mass along the upper sternum region measuring approximately a 5 x 7 cm. This is mildly tender with palpation. There is no skin involvement.    Heart:    Regular rate and rhythm ,no murmur, rub   or gallop     Abdomen:     Soft, non-tender, bowel sounds active all four quadrants,    no masses, no organomegaly        Extremities:   Extremities normal, atraumatic, no cyanosis or edema  Pulses:   2+ and symmetric all  extremities  Skin:   Skin color, texture, turgor normal, no rashes or lesions  Lymph nodes:   Cervical, supraclavicular, and axillary nodes normal  Neurologic:    normal strength, sensation and reflexes    Throughout, limited mobility in the left shoulder region in light of pain.     LABORATORY DATA:  Lab Results  Component Value Date   WBC 8.2 09/10/2012   HGB 12.4 09/10/2012   HCT 38.0 09/10/2012   MCV 88 09/10/2012   PLT 302 09/10/2012   Lab Results  Component Value Date  NA 140 09/10/2012   K 3.9 09/10/2012   CL 104 09/10/2012   CO2 28 09/10/2012   Lab Results  Component Value Date   ALT 37* 09/10/2012   AST 25 09/10/2012   ALKPHOS 97 09/10/2012   BILITOT 0.2* 09/10/2012     RADIOGRAPHY: Dg Chest 2 View  08/31/2012  *RADIOLOGY REPORT*  Clinical Data: Left shoulder pain  CHEST - 2 VIEW  Comparison: None.  Findings: Lingular opacity, favored to reflect atelectasis or scarring. No pleural effusion or pneumothorax.  The heart is normal in size.  Degenerative changes of the visualized thoracolumbar spine.  IMPRESSION: Lingular opacity, favored to reflect atelectasis or scarring.   Original Report Authenticated By: Charline Bills, M.D.    Ct Chest Wo Contrast  08/31/2012  *RADIOLOGY REPORT*  Clinical Data: 67 year old female with anterior chest wall pain, initially on the left side now radiating to the right.  Swelling in the sternocostal sternoclavicular area.  Radiation to the neck and shoulder.  History of hypertension and Parkinson's.  CT CHEST WITHOUT CONTRAST  Technique:  Multidetector CT imaging of the chest was performed following the standard protocol without IV contrast.  Comparison: Chest radiographs 08/31/2012.  Findings: There is a lytic lesion of the center left manubrium situated near the level of the left anterior first rib the junction and bordering but not involving the left sternoclavicular joint. There is evidence of extra osseous extension of the lesion into the adjacent  pectoralis musculature (series 2 image 12).  There is also a small volume of abnormal retrosternal soft tissue suspected (see sagittal image 55).  In all, this lesion encompasses approximately 25 x 36 x 24 mm (transverse by AP by CC).  However, no additional bone lesion is identified in the thorax.  No axillary or internal mammary lymphadenopathy is identified.  There is mass-like opacity in the medial basal segment of the left lower lobe encompassing 48 x 38 x 34 mm (AP by transverse by CC). This extends to but the surface of the diaphragm.  There is trace associated left pleural fluid (image 38).  There is some surrounding irregular pulmonary opacity which more resembles atelectasis.  However, there is no left hilar or mediastinal lymphadenopathy evident.  And additionally, there are numerous scattered indistinct and irregular small pulmonary opacities ranging from 2 mm (right lower lobe series 3 image 25) to 10-12 mm (left upper lobe peripherally image 12).  The smallest of these lesions are the most nodular in appearance, while the larger lesions are more ground- glass or spiculated.  Many seen to be subpleural in location.  The major airways are patent.  The medial basal segment left lower lobe airway as do appear occluded by the mass like opacity.  No pericardial or right pleural effusion.  Calcified atherosclerosis including the left coronary arteries.  Visualized noncontrast upper abdominal viscera are grossly negative; no adrenal mass is identified.  IMPRESSION: Constellation of: lytic lesion of the manubrium with probable extension into the nearby pectoralis muscle attachment, large mass- like opacity in the medial basal segment of the left lower lobe, and scattered small indistinct pulmonary lesions in all pulmonary lobes. Legrand Rams this represents bronchogenic carcinoma with metastatic disease, but the absence of any hilar or mediastinal lymphadenopathy is unusual.  Therefore consider other possibilities  such as lymphoma, perhaps plasmacytoma.  Needle biopsy of the manubrial lesion probably is the best next step in the workup.  Study discussed by telephone with Dr. Pearletha Forge on 08/31/2012 at 1455 hours.   Original  Report Authenticated By: Erskine Speed, M.D.    Nm Pet Image Initial (pi) Skull Base To Thigh  09/09/2012  *RADIOLOGY REPORT*  Clinical Data: Initial treatment strategy for adenocarcinoma of unknown primary.  NUCLEAR MEDICINE PET SKULL BASE TO THIGH  Fasting Blood Glucose:  86  Technique:  9.3 mCi F-18 FDG was injected intravenously.   CT data was obtained and used for attenuation correction and anatomic localization only.  (This was not acquired as a diagnostic CT examination.) Additional exam technical data entered  on technologist worksheet.  Comparison: CT 08/31/2012  Findings:  Neck: No hypermetabolic nodes in the neck.  Chest: There is a medial left lower lobe mass which abuts the pleural surface measuring 4.3 x 3.4 cm with  intense metabolic activity ( SUV max = 23).  There is smaller nodule in the left upper lobe measuring 9 mm (image 96) which does not have discrete hypermetabolic activity.  In the mediastinum, no hypermetabolic pulmonary nodules.  Abdomen / Pelvis:No abnormal hypermetabolic activity within the solid organs.  No evidence of abdominal or pelvic hypermetabolic nodes.  In the pelvis,  there is a large benign appearing cyst associated with the right adnexa measuring 5.4 cm (image 319).  This has no associated metabolic activity.  Skeleton:There is a lytic lesion within the sternum with a large soft tissue component measuring approximately 4.0 x 2.7 cm.  This has intense hypermetabolic activity with SUV max = 26.9.  No additional skeletal metastasis identified.  IMPRESSION:  1.  Hypermetabolic left lower lobe pulmonary mass is most consistent with  bronchogenic carcinoma. 2.  Solitary hypermetabolic  metastasis within the sternum. 3.  Left upper lobe pulmonary nodule does not have  associate metabolic activity.  Recommend attention on follow-up.  4.  Large cystic lesion associated with the right adnexa appears benign.  Consider pelvic ultrasound for characterization.   Original Report Authenticated By: Genevive Bi, M.D.    Ct Biopsy  09/03/2012  *RADIOLOGY REPORT*  Indication: No known primary, now with lytic lesion involving the manubrium and multiple pulmonary nodules including a pulmonary mass within the left lower lobe.  CT GUIDED CORE NEEDLE BIOPSY OF LYTIC LESION INVOLVING THE MANUBRIUM  Comparison: Chest CT - 08/31/2012  Medications: Fentanyl 100 mcg IV; Versed 1 mg IV  Contrast: None  Sedation time: 14 minutes  CT fluoroscopy time:  4 seconds  Complications: None immediate  TECHNIQUE/FINDINGS:  Informed consent was obtained from the patient following an explanation of the procedure, risks, benefits and alternatives.  A time out was performed prior to the initiation of the procedure.  The patient was positioned supine on the CT table and a limited CT was performed for procedural planning demonstrating grossly unchanged appearance of approximately 2.6 x 2.1 cm lytic lesion within the left side of the manubrium and associated less well defined soft tissue mass (image 10, series 3).  The procedure was planned.  The operative site was prepped and draped in the usual sterile fashion.   Appropriate trajectory was confirmed with a 22 gauge spinal needle after the adjacent tissues were anesthetized with 1% Lidocaine with epinephrine.  Under intermittent CT guidance, a 17 gauge coaxial needle was advanced into the anterior peripheral aspect of the soft tissue component of the lytic manubrial mass.  Appropriate positioning was confirmed and 3 samples were obtained with an 18 gauge core needle biopsy device.  The co-axial needle was removed and hemostasis was achieved with manual compression.  A limited post procedural CT was negative  for hemorrhage or additional complication.  A dressing  was placed.  The patient tolerated the procedure well without immediate postprocedural complication.  IMPRESSION:  Technically successful CT guided core needle biopsy of lytic mass involving the manubrium.   Original Report Authenticated By: Tacey Ruiz, MD       IMPRESSION: Probable stage IV non-small cell lung cancer. The patient would be a good candidate for palliative radiation therapy directed at the sternal mass as well as to what appears to be primary site in the left lower medial chest region.  Both of these areas may be contributing to her constellation of pain symptoms.  PLAN: Simulation and planning on April 7 at 8 AM. I anticipate approximately 3 weeks of radiation therapy as part of her management. She is scheduled tomorrow for biopsy of her lung mass to confirm suspected lung cancer primary and to obtain additional tissue for further testing.  I have also scheduled her for MRI the brain to complete staging workup. I spent 60 minutes minutes face to face with the patient and more than 50% of that time was spent in counseling and/or coordination of care.   ------------------------------------------------ -----------------------------------  Billie Lade, PhD, MD

## 2012-09-16 NOTE — Progress Notes (Signed)
Complete PATIENT MEASURE OF DISTRESS worksheet with a score of 7 submitted to social work.  

## 2012-09-16 NOTE — Progress Notes (Signed)
See progress note under physician encounter. 

## 2012-09-17 ENCOUNTER — Telehealth: Payer: Self-pay | Admitting: *Deleted

## 2012-09-17 ENCOUNTER — Encounter (HOSPITAL_COMMUNITY): Payer: Self-pay

## 2012-09-17 ENCOUNTER — Ambulatory Visit (HOSPITAL_COMMUNITY)
Admission: RE | Admit: 2012-09-17 | Discharge: 2012-09-17 | Disposition: A | Discharge status: Home / Self Care | Payer: Medicare Other | Source: Ambulatory Visit | Attending: Interventional Radiology | Admitting: Interventional Radiology

## 2012-09-17 ENCOUNTER — Ambulatory Visit (HOSPITAL_COMMUNITY)
Admission: RE | Admit: 2012-09-17 | Discharge: 2012-09-17 | Disposition: A | Discharge status: Home / Self Care | Payer: Medicare Other | Source: Ambulatory Visit | Attending: Hematology & Oncology | Admitting: Hematology & Oncology

## 2012-09-17 VITALS — BP 104/76 | HR 63 | Temp 98.0°F | Resp 16 | Ht 64.0 in | Wt 168.0 lb

## 2012-09-17 DIAGNOSIS — C3430 Malignant neoplasm of lower lobe, unspecified bronchus or lung: Secondary | ICD-10-CM | POA: Insufficient documentation

## 2012-09-17 DIAGNOSIS — C7951 Secondary malignant neoplasm of bone: Secondary | ICD-10-CM | POA: Insufficient documentation

## 2012-09-17 DIAGNOSIS — R222 Localized swelling, mass and lump, trunk: Secondary | ICD-10-CM | POA: Insufficient documentation

## 2012-09-17 DIAGNOSIS — C3491 Malignant neoplasm of unspecified part of right bronchus or lung: Secondary | ICD-10-CM

## 2012-09-17 DIAGNOSIS — C7952 Secondary malignant neoplasm of bone marrow: Secondary | ICD-10-CM | POA: Insufficient documentation

## 2012-09-17 LAB — PROTIME-INR: Prothrombin Time: 13.6 seconds (ref 11.6–15.2)

## 2012-09-17 LAB — CBC
MCH: 28.6 pg (ref 26.0–34.0)
MCHC: 35 g/dL (ref 30.0–36.0)
Platelets: 342 10*3/uL (ref 150–400)

## 2012-09-17 MED ORDER — HYDROCODONE-ACETAMINOPHEN 5-325 MG PO TABS: 1.0000 | ORAL_TABLET | ORAL | Status: DC | PRN

## 2012-09-17 MED ORDER — SODIUM CHLORIDE 0.9 % IV SOLN: Freq: Once | INTRAVENOUS | Status: DC

## 2012-09-17 MED ORDER — FENTANYL CITRATE 0.05 MG/ML IJ SOLN
INTRAMUSCULAR | Status: AC | PRN
  Administered 2012-09-17: 50 ug via INTRAVENOUS

## 2012-09-17 MED ORDER — FENTANYL CITRATE 0.05 MG/ML IJ SOLN
INTRAMUSCULAR | Status: AC
  Filled 2012-09-17: qty 4

## 2012-09-17 MED ORDER — MIDAZOLAM HCL 2 MG/2ML IJ SOLN
INTRAMUSCULAR | Status: AC | PRN
  Administered 2012-09-17: 1 mg via INTRAVENOUS

## 2012-09-17 MED ORDER — MIDAZOLAM HCL 2 MG/2ML IJ SOLN
INTRAMUSCULAR | Status: AC
  Filled 2012-09-17: qty 6

## 2012-09-17 NOTE — Telephone Encounter (Signed)
CALLED PATIENT TO INFORM OF TEST, LVM FOR A RETURN CALL 

## 2012-09-17 NOTE — H&P (Signed)
[name] Pacheco is an 67 y.o. female.   Chief Complaint: chest and back pain Soreness in shoulder and back 3 weeks ago; CT reveals Left lung mass and sternal lesion Sternal lesion bx 3/20: + metastatic adenocarcinoma Scheduled now for Bx left lung mass To be sent for Genetic studies--need cores HPI: HTN; HLD; Parkinson dz; adeno ca  Past Medical History  Diagnosis Date  . Hypertension   . Hyperlipidemia   . Parkinson disease   . Metastatic lung carcinoma 09/10/2012    Past Surgical History  Procedure Laterality Date  . Tonsillectomy    . Shoulder surgery Left 2012    arthroscopy, debridement    Family History  Problem Relation Age of Onset  . Sudden death Mother   . Sudden death Father   . Heart attack Father    Social History:  reports that she quit smoking about 20 years ago. Her smoking use included Cigarettes. She smoked 0.00 packs per day for 10 years. She has never used smokeless tobacco. She reports that she does not drink alcohol or use illicit drugs.  Allergies: No Known Allergies   (Not in a hospital admission)  No results found for this or any previous visit (from the past 48 hour(s)). No results found.  Review of Systems  Constitutional: Negative for fever.  Respiratory: Positive for shortness of breath.   Cardiovascular: Positive for chest pain.  Gastrointestinal: Positive for abdominal pain. Negative for nausea and vomiting.  Musculoskeletal: Positive for back pain.  Neurological: Positive for weakness and headaches.    Blood pressure 157/94, pulse 62, temperature 97.9 F (36.6 C), temperature source Oral, resp. rate 20, height 5\' 4"  (1.626 m), weight 168 lb (76.204 kg), SpO2 97.00%. Physical Exam  Constitutional: She is oriented to person, place, and time. She appears well-developed and well-nourished.  Cardiovascular: Normal rate, regular rhythm and normal heart sounds.   No murmur heard. Respiratory: Effort normal and breath sounds normal. She  has no wheezes. She exhibits tenderness.  GI: Soft. Bowel sounds are normal. There is no tenderness.  Musculoskeletal: Normal range of motion.  Neurological: She is alert and oriented to person, place, and time.  Psychiatric: She has a normal mood and affect. Her behavior is normal. Judgment and thought content normal.     Assessment/Plan Sternal lesion bx 09/03/12: + met adenca Now scheduled for left lung mass bx To send for genetic studies Scheduled to start chemo next week Pt aware of procedure benefits and risks and agreeable to proceed Consent signed and in  chart  Jentry Warnell A 09/17/2012, 10:18 AM

## 2012-09-17 NOTE — Procedures (Signed)
CT LLL lesion core bx 18g x3 No ptx. No complication No blood loss. See complete dictation in [facility].

## 2012-09-18 ENCOUNTER — Telehealth: Payer: Self-pay | Admitting: *Deleted

## 2012-09-18 NOTE — Telephone Encounter (Signed)
Received phone call from patient asking me to schedule he rtest  at [facility], the scheduler Eber Jones in [facility] said that she would cancel the test for 09-19-12 at 1:00 pm,at [facility]  and reschedule this visit for 09-19-12 at 7:00 am at [facility], spoke with patient and this is what she wants

## 2012-09-18 NOTE — Telephone Encounter (Signed)
Returned patient's phone call, lvm for a return call 

## 2012-09-19 ENCOUNTER — Ambulatory Visit (HOSPITAL_BASED_OUTPATIENT_CLINIC_OR_DEPARTMENT_OTHER)
Admission: RE | Admit: 2012-09-19 | Discharge: 2012-09-19 | Disposition: A | Discharge status: Home / Self Care | Payer: Medicare Other | Source: Ambulatory Visit | Attending: Radiation Oncology | Admitting: Radiation Oncology

## 2012-09-19 ENCOUNTER — Ambulatory Visit (HOSPITAL_COMMUNITY): Admission: RE | Admit: 2012-09-19 | Payer: Medicare Other | Source: Ambulatory Visit

## 2012-09-19 DIAGNOSIS — R51 Headache: Secondary | ICD-10-CM | POA: Insufficient documentation

## 2012-09-19 DIAGNOSIS — C3490 Malignant neoplasm of unspecified part of unspecified bronchus or lung: Secondary | ICD-10-CM | POA: Insufficient documentation

## 2012-09-19 DIAGNOSIS — C7802 Secondary malignant neoplasm of left lung: Secondary | ICD-10-CM

## 2012-09-19 MED ORDER — GADOBENATE DIMEGLUMINE 529 MG/ML IV SOLN
15.0000 mL | Freq: Once | INTRAVENOUS | Status: AC | PRN
  Administered 2012-09-19: 15 mL via INTRAVENOUS

## 2012-09-21 ENCOUNTER — Ambulatory Visit
Admission: RE | Admit: 2012-09-21 | Discharge: 2012-09-21 | Disposition: A | Discharge status: Home / Self Care | Payer: Medicare Other | Source: Ambulatory Visit | Attending: Radiation Oncology | Admitting: Radiation Oncology

## 2012-09-21 VITALS — BP 124/79 | HR 73 | Temp 99.0°F

## 2012-09-21 DIAGNOSIS — C7802 Secondary malignant neoplasm of left lung: Secondary | ICD-10-CM

## 2012-09-21 DIAGNOSIS — C7951 Secondary malignant neoplasm of bone: Secondary | ICD-10-CM | POA: Insufficient documentation

## 2012-09-21 DIAGNOSIS — G20A1 Parkinson's disease without dyskinesia, without mention of fluctuations: Secondary | ICD-10-CM | POA: Insufficient documentation

## 2012-09-21 DIAGNOSIS — Z79899 Other long term (current) drug therapy: Secondary | ICD-10-CM | POA: Insufficient documentation

## 2012-09-21 DIAGNOSIS — Z510 Encounter for antineoplastic radiation therapy: Secondary | ICD-10-CM | POA: Insufficient documentation

## 2012-09-21 DIAGNOSIS — R5381 Other malaise: Secondary | ICD-10-CM | POA: Insufficient documentation

## 2012-09-21 DIAGNOSIS — B370 Candidal stomatitis: Secondary | ICD-10-CM | POA: Insufficient documentation

## 2012-09-21 DIAGNOSIS — C3490 Malignant neoplasm of unspecified part of unspecified bronchus or lung: Secondary | ICD-10-CM | POA: Insufficient documentation

## 2012-09-21 DIAGNOSIS — M25519 Pain in unspecified shoulder: Secondary | ICD-10-CM | POA: Insufficient documentation

## 2012-09-21 DIAGNOSIS — C7952 Secondary malignant neoplasm of bone marrow: Secondary | ICD-10-CM | POA: Insufficient documentation

## 2012-09-21 DIAGNOSIS — G20 Parkinson's disease: Secondary | ICD-10-CM | POA: Insufficient documentation

## 2012-09-21 DIAGNOSIS — R110 Nausea: Secondary | ICD-10-CM | POA: Insufficient documentation

## 2012-09-21 DIAGNOSIS — J029 Acute pharyngitis, unspecified: Secondary | ICD-10-CM | POA: Insufficient documentation

## 2012-09-21 MED ORDER — FLUCONAZOLE 100 MG PO TABS: 100.0000 mg | ORAL_TABLET | Freq: Every day | ORAL | Status: DC

## 2012-09-21 MED ORDER — LORAZEPAM 1 MG PO TABS: 0.5000 mg | ORAL_TABLET | Freq: Three times a day (TID) | ORAL | Status: DC

## 2012-09-21 NOTE — Progress Notes (Addendum)
[name] Pacheco came to the clinic accompanied by her husband.  She had her simulation appointmet today and will start treatment next week.  She has been having a sore throat for 3-4 days.  She says is hurts mostly along the left side of her throat.  The back of her  throat does have scattered white spots on inspection.  She has been gargling with salt water.

## 2012-09-21 NOTE — Progress Notes (Signed)
[facility]    Radiation Oncology [address] [place]     Maryln Gottron, M.D. [place], [place] [address]               Billie Lade, M.D., Ph.D. Phone: [phone]      Molli Hazard A. Kathrynn Running, M.D. Fax: [phone]      Radene Gunning, M.D., Ph.D.         Lurline Hare, M.D.         Grayland Jack, M.D Weekly Treatment Management Note  Name: [name] Pacheco     MRN: [id number]        CSN: [id number] Date: 09/21/2012      DOB: 02-21-1946  CC: Pcp Not In System         Ennever    Status: Outpatient  Diagnosis: The encounter diagnosis was Metastatic lung carcinoma, left.  Narrative: [name] Pacheco was seen today after her simulation.  The patient is been having  sore throat and poor taste.  Review of patient's allergies indicates no known allergies.  Current Outpatient Prescriptions  Medication Sig Dispense Refill  . Calcium Carbonate-Vitamin D (CALTRATE 600+D PO) Take 1 tablet by mouth 2 (two) times daily.      . Cholecalciferol (VITAMIN D) 2000 UNITS CAPS Take 1 capsule by mouth daily.      . cyclobenzaprine (FLEXERIL) 5 MG tablet Take 5 mg by mouth every 8 (eight) hours as needed for muscle spasms.      Marland Kitchen dexamethasone (DECADRON) 4 MG tablet Take 8 mg by mouth 4 (four) times daily.      Marland Kitchen ezetimibe (ZETIA) 10 MG tablet Take 10 mg by mouth every evening.      Marland Kitchen Lysine 500 MG TABS Take 500 mg by mouth 2 (two) times daily.      . metoprolol succinate (TOPROL-XL) 25 MG 24 hr tablet Take 25 mg by mouth daily.      . Multiple Vitamin (MULTIVITAMIN WITH MINERALS) TABS Take 1 tablet by mouth daily.      . Nutritional Supplements (ESTROVEN PO) Take 1 tablet by mouth daily.      Marland Kitchen omeprazole (PRILOSEC) 10 MG capsule Take 10 mg by mouth daily.      Marland Kitchen oxyCODONE (OXY IR/ROXICODONE) 5 MG immediate release tablet Take 1-2 tablets (5-10 mg total) by mouth every 4 (four) hours as needed for pain.  120 tablet  0  . oxyCODONE (OXYCONTIN) 10 MG 12 hr tablet Take 1 tablet (10 mg  total) by mouth every 12 (twelve) hours.  60 tablet  0  . pramipexole (MIRAPEX) 1 MG tablet Take 1-1.5 mg by mouth 3 (three) times daily.      Marland Kitchen zolpidem (AMBIEN CR) 6.25 MG CR tablet Take 6.25 mg by mouth at bedtime.      . fluconazole (DIFLUCAN) 100 MG tablet Take 1 tablet (100 mg total) by mouth daily.  8 tablet  0  . LORazepam (ATIVAN) 1 MG tablet Take 0.5 tablets (0.5 mg total) by mouth every 8 (eight) hours.  30 tablet  0  . oxyCODONE-acetaminophen (PERCOCET) 7.5-325 MG per tablet Take 1 tablet by mouth every 6 (six) hours as needed for pain.       No current facility-administered medications for this encounter.   Labs:  Lab Results  Component Value Date   WBC 20.7* 09/17/2012   HGB 14.4 09/17/2012   HCT 41.1 09/17/2012   MCV 81.7 09/17/2012   PLT 342 09/17/2012   Lab  Results  Component Value Date   CREATININE 0.59 09/10/2012   BUN 20 09/10/2012   NA 140 09/10/2012   K 3.9 09/10/2012   CL 104 09/10/2012   CO2 28 09/10/2012   Lab Results  Component Value Date   ALT 37* 09/10/2012   AST 25 09/10/2012   BILITOT 0.2* 09/10/2012    Physical Examination:  temperature is 99 F (37.2 C). Her blood pressure is 124/79 and her pulse is 73.    Wt Readings from Last 3 Encounters:  09/17/12 168 lb (76.204 kg)  09/16/12 168 lb (76.204 kg)  09/10/12 174 lb (78.926 kg)    Patient has candidal infection along the left soft palate area area Lungs - Normal respiratory effort, chest expands symmetrically. Lungs are clear to auscultation, no crackles or wheezes.  Heart has regular rhythm and rate  Abdomen is soft and non tender with normal bowel sounds  Assessment: Oral pharyngeal candidiasis   Plan the patient will be placed on Diflucan. She will start her treatments early next week.

## 2012-09-22 ENCOUNTER — Telehealth: Payer: Self-pay | Admitting: Oncology

## 2012-09-22 NOTE — Telephone Encounter (Addendum)
Message copied by Lacie Draft on Tue Sep 22, 2012  9:15 AM ------      Message from: Arlan Organ R      Created: Mon Sep 21, 2012  6:07 PM       Call - Brain looks good!!!!  Eugenie Birks!!!  Cindee Lame ------Spoke with patient regarding scan. Teola Bradley, Francesca Strome [employer]

## 2012-09-23 NOTE — Progress Notes (Signed)
  Radiation Oncology         [phone] ________________________________  Name: MARICSA SAMMONS MRN: [id number]  Date: 09/21/2012  DOB: 16-May-1946  SIMULATION AND TREATMENT PLANNING NOTE  DIAGNOSIS:  Stage IV non-small cell lung cancer  NARRATIVE:  The patient was brought to the CT Simulation planning suite.  Identity was confirmed.  All relevant records and images related to the planned course of therapy were reviewed.  The patient freely provided informed written consent to proceed with treatment after reviewing the details related to the planned course of therapy. The consent form was witnessed and verified by the simulation staff.  Then, the patient was set-up in a stable reproducible  supine position for radiation therapy.  CT images were obtained.  Surface markings were placed.  The CT images were loaded into the planning software.  Then the target and avoidance structures were contoured.  Treatment planning then occurred.  The radiation prescription was entered and confirmed.  Then, I designed and supervised the construction of a total of 1 medically necessary complex treatment devices.  I have requested : Isodose Plan. Dose calc.  PLAN:  The patient will receive 37.5 Gy in 15 fractions.  The sternal lesion will receive 15 treatments to a cumulative dose of 37.5 Gy.  I anticipate a 3 field arrangement directed at this area. The primary site in the left lower lung area will also receive 37.5 Gy in 15 fractions. This area will likely be treated with a AP PA beam arrangement with possibly a left posterior oblique field.  ________________________________ -----------------------------------  Billie Lade, PhD, MD

## 2012-09-25 ENCOUNTER — Telehealth: Payer: Self-pay | Admitting: Radiation Oncology

## 2012-09-25 NOTE — Telephone Encounter (Signed)
Patient left message with Sharyl Nimrod, RN of medical oncology intending to contact Dr. Trina Ao office. Returned patient's call. No answer. Left message requesting return call.

## 2012-09-28 ENCOUNTER — Ambulatory Visit
Admission: RE | Admit: 2012-09-28 | Discharge: 2012-09-28 | Disposition: A | Discharge status: Home / Self Care | Payer: Medicare Other | Source: Ambulatory Visit | Attending: Radiation Oncology | Admitting: Radiation Oncology

## 2012-09-28 ENCOUNTER — Telehealth: Payer: Self-pay | Admitting: Radiation Oncology

## 2012-09-28 ENCOUNTER — Encounter: Payer: Self-pay | Admitting: Radiation Oncology

## 2012-09-28 VITALS — BP 128/78 | HR 71

## 2012-09-28 DIAGNOSIS — C7802 Secondary malignant neoplasm of left lung: Secondary | ICD-10-CM

## 2012-09-28 NOTE — Telephone Encounter (Signed)
Called in Valtrex 2000 mg x1 day to pharmacist, Lanora Manis at [facility] as directed by Dr. Roselind Messier.

## 2012-09-28 NOTE — Progress Notes (Signed)
[name]    Radiation Oncology [address] [place]     Maryln Gottron, M.D. [place], [place] [address]               Billie Lade, M.D., Ph.D. Phone: [phone]      Molli Hazard A. Kathrynn Running, M.D. Fax: [phone]      Radene Gunning, M.D., Ph.D.         Lurline Hare, M.D.         Grayland Jack, M.D Weekly Treatment Management Note  Name: [name] Pacheco     MRN: [id number]        CSN: [id number] Date: 09/28/2012      DOB: [birth year]  CC: Norton Blizzard, MD         [place]    Status: Outpatient  Diagnosis: The encounter diagnosis was Metastatic lung carcinoma, left.  Current Dose: 0  Current Fraction: 0  Planned Dose: 35 Gy  Narrative: Patient is seen on her port film day.  She is having significant fatigue at this time and is having difficulty going from sitting to standing position without assistance.  She is also complaining of significant ulcers along the oral cavity and lip region.  Patient was found to have a candidate infection and was placed on Diflucan.  She recently completed her Diflucan prescription.  Patient is having less pain in the left shoulder region.    Review of patient's allergies indicates no known allergies. Current Outpatient Prescriptions  Medication Sig Dispense Refill  . Calcium Carbonate-Vitamin D (CALTRATE 600+D PO) Take 1 tablet by mouth 2 (two) times daily.      . Cholecalciferol (VITAMIN D) 2000 UNITS CAPS Take 1 capsule by mouth daily.      . cyclobenzaprine (FLEXERIL) 5 MG tablet Take 5 mg by mouth every 8 (eight) hours as needed for muscle spasms.      Marland Kitchen dexamethasone (DECADRON) 4 MG tablet Take 8 mg by mouth 4 (four) times daily.      Marland Kitchen ezetimibe (ZETIA) 10 MG tablet Take 10 mg by mouth every evening.      . fluconazole (DIFLUCAN) 100 MG tablet Take 1 tablet (100 mg total) by mouth daily.  8 tablet  0  . LORazepam (ATIVAN) 1 MG tablet Take 0.5 tablets (0.5 mg total) by mouth every 8 (eight) hours.  30 tablet  0  . Lysine 500  MG TABS Take 500 mg by mouth 2 (two) times daily.      . metoprolol succinate (TOPROL-XL) 25 MG 24 hr tablet Take 25 mg by mouth daily.      . Multiple Vitamin (MULTIVITAMIN WITH MINERALS) TABS Take 1 tablet by mouth daily.      . Nutritional Supplements (ESTROVEN PO) Take 1 tablet by mouth daily.      Marland Kitchen omeprazole (PRILOSEC) 10 MG capsule Take 10 mg by mouth daily.      Marland Kitchen oxyCODONE (OXY IR/ROXICODONE) 5 MG immediate release tablet Take 1-2 tablets (5-10 mg total) by mouth every 4 (four) hours as needed for pain.  120 tablet  0  . oxyCODONE (OXYCONTIN) 10 MG 12 hr tablet Take 1 tablet (10 mg total) by mouth every 12 (twelve) hours.  60 tablet  0  . pramipexole (MIRAPEX) 1 MG tablet Take 1-1.5 mg by mouth 3 (three) times daily.      Marland Kitchen zolpidem (AMBIEN CR) 6.25 MG CR tablet Take 6.25 mg by mouth at bedtime.      . valACYclovir (VALTREX)  1000 MG tablet Take 2,000 mg by mouth once.       No current facility-administered medications for this encounter.   Labs:  Lab Results  Component Value Date   WBC 20.7* 09/17/2012   HGB 14.4 09/17/2012   HCT 41.1 09/17/2012   MCV 81.7 09/17/2012   PLT 342 09/17/2012   Lab Results  Component Value Date   CREATININE 0.59 09/10/2012   BUN 20 09/10/2012   NA 140 09/10/2012   K 3.9 09/10/2012   CL 104 09/10/2012   CO2 28 09/10/2012   Lab Results  Component Value Date   ALT 37* 09/10/2012   AST 25 09/10/2012   BILITOT 0.2* 09/10/2012    Physical Examination:  blood pressure is 128/78 and her pulse is 71.    Wt Readings from Last 3 Encounters:  09/17/12 168 lb (76.204 kg)  09/16/12 168 lb (76.204 kg)  09/10/12 174 lb (78.926 kg)    The oral cavity is moist. Patient does have several ulcers along lip tongue and buccal mucosa Lungs - Normal respiratory effort, chest expands symmetrically. Lungs are clear to auscultation, no crackles or wheezes.  Heart has regular rhythm and rate  Abdomen is soft and non tender with normal bowel sounds The patient is in a  wheelchair at this time. She was unable to stand up without assistance. Motor strength appears to be 4/5 throughout the proximal and distal muscle groups of the upper and lower extremities.  Assessment:  Unexplained severe fatigue and weakness. I did discuss these issues with her medical oncologist.  The patient has been on steroids for proximally 4 weeks.  We will taper her steroids to see if this will help with her generalized weakness and muscle fatigue.  Patient will also be given a prescription for Valtrex for her cold sores.   Plan: Continue treatment per original radiation prescription.  She will begin her radiation therapy tomorrow with treatments directed at the sternum and left lower lung lesion.

## 2012-09-28 NOTE — Progress Notes (Signed)
  Radiation Oncology         [phone] ________________________________  Name: [name] Pacheco MRN: [id number]  Date: 09/28/2012  DOB: December 08, 1945  Simulation Verification Note  Status: outpatient  NARRATIVE: The patient was brought to the treatment unit and placed in the planned treatment position. The clinical setup was verified. Then port films were obtained and uploaded to the radiation oncology medical record software.  The treatment beams were carefully compared against the planned radiation fields. The position location and shape of the radiation fields was reviewed. They targeted volume of tissue appears to be appropriately covered by the radiation beams. Organs at risk appear to be excluded as planned.  Based on my personal review, I approved the simulation verification. The patient's treatment will proceed as planned.  -----------------------------------  Billie Lade, PhD, MD

## 2012-09-28 NOTE — Progress Notes (Signed)
[name] Pacheco here today with c/o increasing weakness of her lower extremities with the inability to get up from a sitting position without help which she states started at the end of last week.  She denies any pain nor numbness.    Also note questionable ulcers on the sides of her tongue and a sore on her right lower lip.  She just completed a script for Diflucan, but continues to c/o  soreness of mouth and throat.  Note Puffiness of her face since being on Decadron.  She is concerned that the Decadron has caused her to experience her weakness.

## 2012-09-29 ENCOUNTER — Ambulatory Visit
Admission: RE | Admit: 2012-09-29 | Discharge: 2012-09-29 | Disposition: A | Discharge status: Home / Self Care | Payer: Medicare Other | Source: Ambulatory Visit | Attending: Radiation Oncology | Admitting: Radiation Oncology

## 2012-09-29 ENCOUNTER — Other Ambulatory Visit: Payer: Self-pay | Admitting: Hematology & Oncology

## 2012-09-29 ENCOUNTER — Encounter: Payer: Self-pay | Admitting: Radiation Oncology

## 2012-09-29 VITALS — BP 142/82 | HR 77 | Resp 18 | Wt 160.6 lb

## 2012-09-29 DIAGNOSIS — C7802 Secondary malignant neoplasm of left lung: Secondary | ICD-10-CM

## 2012-09-29 DIAGNOSIS — B029 Zoster without complications: Secondary | ICD-10-CM

## 2012-09-29 MED ORDER — VALACYCLOVIR HCL 500 MG PO TABS: 500.0000 mg | ORAL_TABLET | Freq: Three times a day (TID) | ORAL | Status: DC

## 2012-09-29 NOTE — Progress Notes (Signed)
  Radiation Oncology         [phone] ________________________________  Name: [name] Pacheco MRN: [id number]  Date: 09/29/2012  DOB: [birth year]  Weekly Radiation Therapy Management  Current Dose: 2.5 Gy     Planned Dose:  37.5 Gy  Narrative . . . . . . . . The patient presents for routine under treatment assessment.                                                     The patient is without complaint.  She has noticed improvement in her muscle strength after tapering her steroids yesterday. In addition the Valtrex has helped her mouth ulcers significantly.                                 Set-up films were reviewed.                                 The chart was checked. Physical Findings. . .  weight is 160 lb 9.6 oz (72.848 kg). Her blood pressure is 142/82 and her pulse is 77. Her respiration is 18 and oxygen saturation is 97%. . Weight essentially stable.  No significant changes. Impression . . . . . . . The patient is  tolerating radiation. Plan . . . . . . . . . . . . Continue treatment as planned.  ________________________________  -----------------------------------  Billie Lade, PhD, MD

## 2012-09-29 NOTE — Progress Notes (Addendum)
Patient presents to the clinic today accompanied by her husband for PUT with Dr. Roselind Messier. Patient alert and oriented to person, place, and time. No distress noted. Slow wobbley gait noted. Patient continues to report bilateral leg and left shoulder weakness. Pleasant affect noted. Patient reports her left shoulder pain is manageable with oxycontin every 12 hour. Patient reports that the once dose of valtrex has really help to relieve her mouth, lips and buccal mucosa sores. Dr. Myna Hidalgo has prescribed three more doses of valtrex. Patient questions if she can resume taking zocor since she is done with diflucan. Patient reports fatigue continues but, is not as severe as yesterday. Patient denies cough or shortness. Patient denies skin changes within treatment field. Patient reports painful swallowing but, not as bad as yesterday. Decadron dose decreased from 8 mg qid to 8 mg bid. Patient reports that decreasing the decadron dose has improved her issue with weakness. Patient denies nausea, vomiting, headache or dizziness. Sternal mass remains visible and palpable. Reported all findings to Dr. Roselind Messier.

## 2012-09-30 ENCOUNTER — Ambulatory Visit
Admission: RE | Admit: 2012-09-30 | Discharge: 2012-09-30 | Disposition: A | Discharge status: Home / Self Care | Payer: Medicare Other | Source: Ambulatory Visit | Attending: Radiation Oncology | Admitting: Radiation Oncology

## 2012-10-01 ENCOUNTER — Telehealth: Payer: Self-pay | Admitting: *Deleted

## 2012-10-01 ENCOUNTER — Ambulatory Visit (HOSPITAL_BASED_OUTPATIENT_CLINIC_OR_DEPARTMENT_OTHER): Payer: Medicare Other

## 2012-10-01 ENCOUNTER — Other Ambulatory Visit: Payer: Self-pay | Admitting: *Deleted

## 2012-10-01 ENCOUNTER — Other Ambulatory Visit (HOSPITAL_BASED_OUTPATIENT_CLINIC_OR_DEPARTMENT_OTHER): Payer: Medicare Other | Admitting: Lab

## 2012-10-01 ENCOUNTER — Ambulatory Visit
Admission: RE | Admit: 2012-10-01 | Discharge: 2012-10-01 | Disposition: A | Discharge status: Home / Self Care | Payer: Medicare Other | Source: Ambulatory Visit | Attending: Radiation Oncology | Admitting: Radiation Oncology

## 2012-10-01 VITALS — BP 149/98 | HR 81 | Temp 97.7°F

## 2012-10-01 DIAGNOSIS — C7802 Secondary malignant neoplasm of left lung: Secondary | ICD-10-CM

## 2012-10-01 DIAGNOSIS — R5383 Other fatigue: Secondary | ICD-10-CM

## 2012-10-01 DIAGNOSIS — C7800 Secondary malignant neoplasm of unspecified lung: Secondary | ICD-10-CM

## 2012-10-01 DIAGNOSIS — R5381 Other malaise: Secondary | ICD-10-CM

## 2012-10-01 DIAGNOSIS — E139 Other specified diabetes mellitus without complications: Secondary | ICD-10-CM

## 2012-10-01 DIAGNOSIS — R739 Hyperglycemia, unspecified: Secondary | ICD-10-CM

## 2012-10-01 DIAGNOSIS — C801 Malignant (primary) neoplasm, unspecified: Secondary | ICD-10-CM

## 2012-10-01 DIAGNOSIS — R531 Weakness: Secondary | ICD-10-CM

## 2012-10-01 DIAGNOSIS — E860 Dehydration: Secondary | ICD-10-CM

## 2012-10-01 LAB — CBC WITH DIFFERENTIAL/PLATELET
Eosinophils Absolute: 0 10*3/uL (ref 0.0–0.5)
LYMPH%: 1.5 % — ABNORMAL LOW (ref 14.0–49.7)
MCV: 85.2 fL (ref 79.5–101.0)
MONO%: 3.8 % (ref 0.0–14.0)
NEUT#: 17.4 10*3/uL — ABNORMAL HIGH (ref 1.5–6.5)
Platelets: 132 10*3/uL — ABNORMAL LOW (ref 145–400)
RBC: 4.92 10*6/uL (ref 3.70–5.45)

## 2012-10-01 LAB — COMPREHENSIVE METABOLIC PANEL (CC13)
Alkaline Phosphatase: 69 U/L (ref 40–150)
BUN: 22.2 mg/dL (ref 7.0–26.0)
Glucose: 196 mg/dl — ABNORMAL HIGH (ref 70–99)
Sodium: 130 mEq/L — ABNORMAL LOW (ref 136–145)
Total Bilirubin: 0.64 mg/dL (ref 0.20–1.20)
Total Protein: 6.4 g/dL (ref 6.4–8.3)

## 2012-10-01 MED ORDER — INSULIN REGULAR HUMAN 100 UNIT/ML IJ SOLN
5.0000 [IU] | Freq: Once | INTRAMUSCULAR | Status: AC
  Administered 2012-10-01: 5 [IU] via SUBCUTANEOUS
  Filled 2012-10-01: qty 0.05

## 2012-10-01 MED ORDER — SODIUM CHLORIDE 0.9 % IV SOLN
Freq: Once | INTRAVENOUS | Status: AC
  Administered 2012-10-01: 15:00:00 via INTRAVENOUS

## 2012-10-01 NOTE — Patient Instructions (Addendum)
Dehydration, Adult Dehydration is when you lose more fluids from the body than you take in. Vital organs like the kidneys, brain, and heart cannot function without a proper amount of fluids and salt. Any loss of fluids from the body can cause dehydration.  CAUSES   Vomiting.  Diarrhea.  Excessive sweating.  Excessive urine output.  Fever. SYMPTOMS  Mild dehydration  Thirst.  Dry lips.  Slightly dry mouth. Moderate dehydration  Very dry mouth.  Sunken eyes.  Skin does not bounce back quickly when lightly pinched and released.  Dark urine and decreased urine production.  Decreased tear production.  Headache. Severe dehydration  Very dry mouth.  Extreme thirst.  Rapid, weak pulse (more than 100 beats per minute at rest).  Cold hands and feet.  Not able to sweat in spite of heat and temperature.  Rapid breathing.  Blue lips.  Confusion and lethargy.  Difficulty being awakened.  Minimal urine production.  No tears. DIAGNOSIS  Your caregiver will diagnose dehydration based on your symptoms and your exam. Blood and urine tests will help confirm the diagnosis. The diagnostic evaluation should also identify the cause of dehydration. TREATMENT  Treatment of mild or moderate dehydration can often be done at home by increasing the amount of fluids that you drink. It is best to drink small amounts of fluid more often. Drinking too much at one time can make vomiting worse. Refer to the home care instructions below. Severe dehydration needs to be treated at the hospital where you will probably be given intravenous (IV) fluids that contain water and electrolytes. HOME CARE INSTRUCTIONS   Ask your caregiver about specific rehydration instructions.  Drink enough fluids to keep your urine clear or pale yellow.  Drink small amounts frequently if you have nausea and vomiting.  Eat as you normally do.  Avoid:  Foods or drinks high in sugar.  Carbonated  drinks.  Juice.  Extremely hot or cold fluids.  Drinks with caffeine.  Fatty, greasy foods.  Alcohol.  Tobacco.  Overeating.  Gelatin desserts.  Wash your hands well to avoid spreading bacteria and viruses.  Only take over-the-counter or prescription medicines for pain, discomfort, or fever as directed by your caregiver.  Ask your caregiver if you should continue all prescribed and over-the-counter medicines.  Keep all follow-up appointments with your caregiver. SEEK MEDICAL CARE IF:  You have abdominal pain and it increases or stays in one area (localizes).  You have a rash, stiff neck, or severe headache.  You are irritable, sleepy, or difficult to awaken.  You are weak, dizzy, or extremely thirsty. SEEK IMMEDIATE MEDICAL CARE IF:   You are unable to keep fluids down or you get worse despite treatment.  You have frequent episodes of vomiting or diarrhea.  You have blood or green matter (bile) in your vomit.  You have blood in your stool or your stool looks black and tarry.  You have not urinated in 6 to 8 hours, or you have only urinated a small amount of very dark urine.  You have a fever.  You faint. MAKE SURE YOU:   Understand these instructions.  Will watch your condition.  Will get help right away if you are not doing well or get worse. Document Released: 06/03/2005 Document Revised: 08/26/2011 Document Reviewed: 01/21/2011 Exit[employer] Patient Information 2013 [employer].  

## 2012-10-01 NOTE — Telephone Encounter (Signed)
Pt lab and infusion aware of appointments today. Pt aware to call here after radiation. Amy RN aware

## 2012-10-01 NOTE — Telephone Encounter (Addendum)
Pt's husband called with concerns about weakness, fatigue, mouth sores, and a couple of falls. She has received 3 XRTs. They have traced it back to when she starting tapering the Decadron and started the Ativan. She has also been taking Ambien CR 6.25 mg and Oxycontin 10 mg. Explained that it may be from the sedating medications. Reviewed with Dr Myna Hidalgo. To cut back on Lorazepam and use only if needed for anxiety. Her husband thinks she really hasn't had anxiety just  "the shakes from her Parkinson's Disease". Will also check her CMET and CBC prior to XRT then give IVF if needed. CBC shows an elevated WBC and CMET shows elevated glucose from steroids. Final orders per Dr Myna Hidalgo are to give NS over 2 hours and 5 units of RHI SQ. This information was provided to [place], Charity fundraiser at the [employer] at [facility] in [place].

## 2012-10-02 ENCOUNTER — Ambulatory Visit
Admission: RE | Admit: 2012-10-02 | Discharge: 2012-10-02 | Disposition: A | Discharge status: Home / Self Care | Payer: Medicare Other | Source: Ambulatory Visit | Attending: Radiation Oncology | Admitting: Radiation Oncology

## 2012-10-02 ENCOUNTER — Telehealth: Payer: Self-pay | Admitting: Neurology

## 2012-10-02 ENCOUNTER — Telehealth: Payer: Self-pay | Admitting: Nurse Practitioner

## 2012-10-02 NOTE — Telephone Encounter (Signed)
[name] Pacheco, give her a followup appt in 1-2 weeks.

## 2012-10-02 NOTE — Telephone Encounter (Signed)
Dr. Terrace Arabia pt called and has been diagnosed with Lung Cancer 09-10-12.  She is currently day 4 of 3 wk course of RT.  Chemo course not yet decided.  S he has been having since about 2 wks bilateral leg weakness, has fallen twice, cannot hardly lift or pull herself from sitting position.  Wonders if PD or med.  Please advise.

## 2012-10-05 ENCOUNTER — Ambulatory Visit
Admission: RE | Admit: 2012-10-05 | Discharge: 2012-10-05 | Disposition: A | Discharge status: Home / Self Care | Payer: Medicare Other | Source: Ambulatory Visit | Attending: Radiation Oncology | Admitting: Radiation Oncology

## 2012-10-05 NOTE — Telephone Encounter (Signed)
Patient is coming to see Dr.Yan 10/06/2012.

## 2012-10-06 ENCOUNTER — Encounter: Payer: Self-pay | Admitting: Radiation Oncology

## 2012-10-06 ENCOUNTER — Ambulatory Visit
Admission: RE | Admit: 2012-10-06 | Discharge: 2012-10-06 | Disposition: A | Discharge status: Home / Self Care | Payer: Medicare Other | Source: Ambulatory Visit | Attending: Radiation Oncology | Admitting: Radiation Oncology

## 2012-10-06 ENCOUNTER — Ambulatory Visit (INDEPENDENT_AMBULATORY_CARE_PROVIDER_SITE_OTHER): Payer: Medicare Other | Admitting: Neurology

## 2012-10-06 ENCOUNTER — Encounter: Payer: Self-pay | Admitting: Neurology

## 2012-10-06 ENCOUNTER — Telehealth: Payer: Self-pay | Admitting: Radiation Oncology

## 2012-10-06 ENCOUNTER — Encounter: Payer: Self-pay | Admitting: Hematology & Oncology

## 2012-10-06 VITALS — Ht 64.0 in | Wt 155.0 lb

## 2012-10-06 VITALS — BP 140/102 | HR 90 | Resp 18 | Wt 155.6 lb

## 2012-10-06 DIAGNOSIS — G20A1 Parkinson's disease without dyskinesia, without mention of fluctuations: Secondary | ICD-10-CM

## 2012-10-06 DIAGNOSIS — M25519 Pain in unspecified shoulder: Secondary | ICD-10-CM | POA: Insufficient documentation

## 2012-10-06 DIAGNOSIS — I10 Essential (primary) hypertension: Secondary | ICD-10-CM

## 2012-10-06 DIAGNOSIS — C3490 Malignant neoplasm of unspecified part of unspecified bronchus or lung: Secondary | ICD-10-CM | POA: Insufficient documentation

## 2012-10-06 DIAGNOSIS — E785 Hyperlipidemia, unspecified: Secondary | ICD-10-CM | POA: Insufficient documentation

## 2012-10-06 DIAGNOSIS — R531 Weakness: Secondary | ICD-10-CM

## 2012-10-06 DIAGNOSIS — R5381 Other malaise: Secondary | ICD-10-CM

## 2012-10-06 DIAGNOSIS — C7802 Secondary malignant neoplasm of left lung: Secondary | ICD-10-CM

## 2012-10-06 DIAGNOSIS — G20 Parkinson's disease: Secondary | ICD-10-CM

## 2012-10-06 MED ORDER — PYRIDOSTIGMINE BROMIDE 60 MG PO TABS: 60.0000 mg | ORAL_TABLET | Freq: Three times a day (TID) | ORAL | Status: DC

## 2012-10-06 NOTE — Progress Notes (Signed)
HPI:  She has been followed here by Dr. Thad Ranger  since July 2008 for possible Parkinson's disease.  She has PMHx of HTN, Hyperlipidemia, she has gradual onset left-handed tremor. She has taken Mirapex with good relief, and that she has restless leg symptoms, responding well to Mirapex,  She denied REM sleep disorder, she does snore, denies excessive daytime drowsiness. She has gradual worsening sense of smell for few years, she denies gait difficulty, no new neurological issues.   She developed subacute onset of bilateral lower extremity weakness, gait difficulty, the point of difficulty getting up from chairs  in early April 2014, also noticed a bump in her chest, left shoulder pain, radiating pain to her left arm, CT of the chest showed lytic lesion of the manubrium with probable extension into the nearby pectoralis muscle attachment, large mass- like opacity in the medial basal segment of the left lower lobe,  and scattered small indistinct pulmonary lesions in all pulmonary lobes, represents bronchogenic carcinoma with metastatic disease, but the absence of any hilar or mediastinal lymphadenopathy is unusual.   Biopsy consistent with pulmonary adenocarcinoma, she was given dexamethasone 4 mg 8 tablets a day, over the past 1-2 weeks, she reported marked improvement, now tapering down to 2 tablets twice a day, she continues to have gait difficulty, difficulty bearing weight at her best she can walk with a walker,She needs assistant to transfer herself, she also has mild bilateral upper extremity weakness, she has dry mouth, she denies double vision, no swallowing difficulty. She denies sensory changes.  She is going through radiation therapy, will followed by chemotherapy.     Physical Exam  General: dry mouth, sitting in wheelchair Neck: supple no carotid bruits Respiratory: clear to auscultation bilaterally Cardiovascular: regular rate rhythm  Neurologic Exam  Mental Status: pleasant,  awake, alert, cooperative to history, talking, and casual conversation. Cranial Nerves: CN II-XII pupils were equal round reactive to light.  Fundi were sharp bilaterally.  Extraocular movements were full.  Visual fields were full on confrontational test.  Facial sensation and strength were normal.  Hearing was intact to finger rubbing bilaterally.  Uvula tongue were midline.  Head turning and shoulder shrugging were normal and symmetric.  Tongue protrusion into the cheeks strength were normal. Narrow oropharyngeal.  Motor: shoulder abduction 5/4, external rotation 5/4, elbow flexion 5/4, elbow extension 5/5, wrist flexion 5/4, wrist extension 5/4, hip flexion 2/2, knee flexion 4/3, knee extension 4/3+, ankle dorsiflexion 3/3, ankle plantar flexion 4/4.  Sensory: Normal to light touch, pinprick, proprioception, and vibratory sensation. Coordination: Normal finger-to-nose.There was no dysmetria noticed. Gait and Station: need two people assistance to get up from seated position, difficulty bearing weight. Reflexes: areflexia. Plantar responses are flexor.   Assessment and Plan:  Subacute onset of bilateral lower extremity weakness, proximal upper extremity weakness, no sensory loss, dry malls, no bulbar weakness, in the setting of metastatic lung adenocarcinoma,  1, most suggestive of lambert-eaton syndrome 2.  Will check voltage guided calcium channel 3. Mestinon 60mg  tid. 4. RTC in one month

## 2012-10-06 NOTE — Telephone Encounter (Signed)
Returning [name] Pacheco' call from [facility]. He is unavailable. Left message with contact information requesting return call.

## 2012-10-06 NOTE — Progress Notes (Signed)
  Radiation Oncology         [phone] ________________________________  Name: [name] Pacheco MRN: [id number]  Date: 10/06/2012  DOB: [birth year]  Weekly Radiation Therapy Management  Current Dose: 15 Gy     Planned Dose:  37.5 Gy  Narrative . . . . . . . . The patient presents for routine under treatment assessment.       Her pain is well-controlled with just short acting oxycodone. She stopped taking her OxyContin. She is having less soreness in her mouth related to her ulcers. Her overall weakness seems to be improving but she still has difficulty getting out of chairs. She is inquiring about a motorized chair lift. She will see her neurologist this afternoon for additional evaluation. The patient does have a history of Parkinson's disease.                                 Set-up films were reviewed.                                 The chart was checked. Physical Findings. . .  weight is 155 lb 9.6 oz (70.58 kg). Her blood pressure is 140/102 and her pulse is 90. Her respiration is 18 and oxygen saturation is 99%. . The sternal mass shows no significant shrinkage at this time area.  there is minimal skin reaction noted.  The oral cavity is free of secondary infection.  The ulcers are continuing to resolve this time. Impression . . . . . . . The patient is  tolerating radiation. Plan . . . . . . . . . . . . Continue treatment as planned.  ________________________________  -----------------------------------  Billie Lade, PhD, MD

## 2012-10-06 NOTE — Progress Notes (Signed)
Patient presented to the clinic today accompanied by her husband for PUT with Dr. Roselind Messier. Patient alert and oriented to person, place, and time. No distress noted. Slow steady gait noted with assistance of rolling wheelchair. Pleasant affect noted. Patient denies pain at this time. Patient reports that the weakness in her legs is improving slowly since she stopped taking "so much medication." Patient reports that she has stopped taking her flexeril and oxycontin. Also, she reports she is taking decadron 2 mg bid and lysine prn. Patient reports taking oxy IR 5 mg only one tablet prn. Patient reports that dry mouth continues. Patient reports ulcerations within mouth are a lot better with only one on each side of inner cheeks and a cold sore on her lower lip. Patient denies painful or difficulty swallowing. Patient reports she is only hoarse occasionally. Patient plans to see neurologist this afternoon around 1400. No skin changes noted to treatment area. Patient reports using radiaplex as directed. Patient reports an excellent appetite and that she "is eating well." However, patient has lost 5 pounds since PUT on 09/29/2012. Scheduled patient to see B. Neff, RD on 10/09/2012 at 1200. Reported all findings to Dr. Roselind Messier.

## 2012-10-07 ENCOUNTER — Other Ambulatory Visit: Payer: Self-pay | Admitting: Hematology & Oncology

## 2012-10-07 ENCOUNTER — Telehealth: Payer: Self-pay | Admitting: Hematology & Oncology

## 2012-10-07 ENCOUNTER — Ambulatory Visit
Admission: RE | Admit: 2012-10-07 | Discharge: 2012-10-07 | Disposition: A | Discharge status: Home / Self Care | Payer: Medicare Other | Source: Ambulatory Visit | Attending: Radiation Oncology | Admitting: Radiation Oncology

## 2012-10-07 DIAGNOSIS — C7800 Secondary malignant neoplasm of unspecified lung: Secondary | ICD-10-CM

## 2012-10-07 NOTE — Telephone Encounter (Signed)
Pt aware of 4-24 appointment °

## 2012-10-08 ENCOUNTER — Other Ambulatory Visit: Payer: Self-pay | Admitting: Hematology & Oncology

## 2012-10-08 ENCOUNTER — Ambulatory Visit (HOSPITAL_BASED_OUTPATIENT_CLINIC_OR_DEPARTMENT_OTHER): Payer: Medicare Other

## 2012-10-08 ENCOUNTER — Ambulatory Visit (HOSPITAL_BASED_OUTPATIENT_CLINIC_OR_DEPARTMENT_OTHER): Payer: Medicare Other | Admitting: Hematology & Oncology

## 2012-10-08 ENCOUNTER — Other Ambulatory Visit (HOSPITAL_BASED_OUTPATIENT_CLINIC_OR_DEPARTMENT_OTHER): Payer: Medicare Other | Admitting: Lab

## 2012-10-08 ENCOUNTER — Ambulatory Visit
Admission: RE | Admit: 2012-10-08 | Discharge: 2012-10-08 | Disposition: A | Discharge status: Home / Self Care | Payer: Medicare Other | Source: Ambulatory Visit | Attending: Radiation Oncology | Admitting: Radiation Oncology

## 2012-10-08 DIAGNOSIS — C7952 Secondary malignant neoplasm of bone marrow: Secondary | ICD-10-CM

## 2012-10-08 DIAGNOSIS — C3430 Malignant neoplasm of lower lobe, unspecified bronchus or lung: Secondary | ICD-10-CM

## 2012-10-08 DIAGNOSIS — C7800 Secondary malignant neoplasm of unspecified lung: Secondary | ICD-10-CM

## 2012-10-08 DIAGNOSIS — C7802 Secondary malignant neoplasm of left lung: Secondary | ICD-10-CM

## 2012-10-08 LAB — CMP (CANCER CENTER ONLY)
Albumin: 2.7 g/dL — ABNORMAL LOW (ref 3.3–5.5)
Alkaline Phosphatase: 54 U/L (ref 26–84)
BUN, Bld: 18 mg/dL (ref 7–22)
CO2: 26 mEq/L (ref 18–33)
Glucose, Bld: 160 mg/dL — ABNORMAL HIGH (ref 73–118)
Potassium: 4 mEq/L (ref 3.3–4.7)
Total Bilirubin: 0.8 mg/dl (ref 0.20–1.60)

## 2012-10-08 LAB — CBC WITH DIFFERENTIAL (CANCER CENTER ONLY)
BASO#: 0 10*3/uL (ref 0.0–0.2)
EOS%: 0 % (ref 0.0–7.0)
Eosinophils Absolute: 0 10*3/uL (ref 0.0–0.5)
HCT: 38.4 % (ref 34.8–46.6)
HGB: 12.8 g/dL (ref 11.6–15.9)
MCH: 28.6 pg (ref 26.0–34.0)
MCHC: 33.3 g/dL (ref 32.0–36.0)
MCV: 86 fL (ref 81–101)
MONO%: 4.9 % (ref 0.0–13.0)
NEUT%: 92.8 % — ABNORMAL HIGH (ref 39.6–80.0)

## 2012-10-08 LAB — TECHNOLOGIST REVIEW CHCC SATELLITE

## 2012-10-08 MED ORDER — SODIUM CHLORIDE 0.9 % IV SOLN
Freq: Once | INTRAVENOUS | Status: AC
  Administered 2012-10-08: 14:00:00 via INTRAVENOUS

## 2012-10-08 MED ORDER — ZOLEDRONIC ACID 4 MG/100ML IV SOLN
4.0000 mg | Freq: Once | INTRAVENOUS | Status: AC
  Administered 2012-10-08: 4 mg via INTRAVENOUS
  Filled 2012-10-08: qty 100

## 2012-10-08 NOTE — Addendum Note (Signed)
Addended by: Levert Feinstein on: 10/08/2012 10:18 PM   Modules accepted: Orders

## 2012-10-08 NOTE — Patient Instructions (Addendum)
Zoledronic Acid injection (Hypercalcemia, Oncology) What is this medicine? ZOLEDRONIC ACID (ZOE le dron ik AS id) lowers the amount of calcium loss from bone. It is used to treat too much calcium in your blood from cancer. It is also used to prevent complications of cancer that has spread to the bone. This medicine may be used for other purposes; ask your health care provider or pharmacist if you have questions. What should I tell my health care provider before I take this medicine? They need to know if you have any of these conditions: -aspirin-sensitive asthma -dental disease -kidney disease -an unusual or allergic reaction to zoledronic acid, other medicines, foods, dyes, or preservatives -pregnant or trying to get pregnant -breast-feeding How should I use this medicine? This medicine is for infusion into a vein. It is given by a health care professional in a hospital or clinic setting. Talk to your pediatrician regarding the use of this medicine in children. Special care may be needed. Overdosage: If you think you have taken too much of this medicine contact a poison control center or emergency room at once. NOTE: This medicine is only for you. Do not share this medicine with others. What if I miss a dose? It is important not to miss your dose. Call your doctor or health care professional if you are unable to keep an appointment. What may interact with this medicine? -certain antibiotics given by injection -NSAIDs, medicines for pain and inflammation, like ibuprofen or naproxen -some diuretics like bumetanide, furosemide -teriparatide -thalidomide This list may not describe all possible interactions. Give your health care provider a list of all the medicines, herbs, non-prescription drugs, or dietary supplements you use. Also tell them if you smoke, drink alcohol, or use illegal drugs. Some items may interact with your medicine. What should I watch for while using this medicine? Visit  your doctor or health care professional for regular checkups. It may be some time before you see the benefit from this medicine. Do not stop taking your medicine unless your doctor tells you to. Your doctor may order blood tests or other tests to see how you are doing. Women should inform their doctor if they wish to become pregnant or think they might be pregnant. There is a potential for serious side effects to an unborn child. Talk to your health care professional or pharmacist for more information. You should make sure that you get enough calcium and vitamin D while you are taking this medicine. Discuss the foods you eat and the vitamins you take with your health care professional. Some people who take this medicine have severe bone, joint, and/or muscle pain. This medicine may also increase your risk for a broken thigh bone. Tell your doctor right away if you have pain in your upper leg or groin. Tell your doctor if you have any pain that does not go away or that gets worse. What side effects may I notice from receiving this medicine? Side effects that you should report to your doctor or health care professional as soon as possible: -allergic reactions like skin rash, itching or hives, swelling of the face, lips, or tongue -anxiety, confusion, or depression -breathing problems -changes in vision -feeling faint or lightheaded, falls -jaw burning, cramping, pain -muscle cramps, stiffness, or weakness -trouble passing urine or change in the amount of urine Side effects that usually do not require medical attention (report to your doctor or health care professional if they continue or are bothersome): -bone, joint, or muscle pain -  fever -hair loss -irritation at site where injected -loss of appetite -nausea, vomiting -stomach upset -tired This list may not describe all possible side effects. Call your doctor for medical advice about side effects. You may report side effects to FDA at  1-800-FDA-1088. Where should I keep my medicine? This drug is given in a hospital or clinic and will not be stored at home. NOTE: This sheet is a summary. It may not cover all possible information. If you have questions about this medicine, talk to your doctor, pharmacist, or health care provider.  2012, [employer]vier/[employer] Standard. (11/30/2010 9:06:58 AM) 

## 2012-10-08 NOTE — Progress Notes (Signed)
This office note has been dictated.

## 2012-10-08 NOTE — Progress Notes (Signed)
Quick Note:  Please call patient, Lab evaluation was negative, I will order MRI lumbar w/wo contrast for her. Still keep follow up appointment ______

## 2012-10-09 ENCOUNTER — Ambulatory Visit
Admission: RE | Admit: 2012-10-09 | Discharge: 2012-10-09 | Disposition: A | Discharge status: Home / Self Care | Payer: Medicare Other | Source: Ambulatory Visit | Attending: Radiation Oncology | Admitting: Radiation Oncology

## 2012-10-09 ENCOUNTER — Telehealth: Payer: Self-pay | Admitting: Hematology & Oncology

## 2012-10-09 ENCOUNTER — Ambulatory Visit: Payer: Medicare Other | Admitting: Nutrition

## 2012-10-09 ENCOUNTER — Telehealth: Payer: Self-pay | Admitting: Dietician

## 2012-10-09 ENCOUNTER — Telehealth: Payer: Self-pay | Admitting: *Deleted

## 2012-10-09 NOTE — Telephone Encounter (Signed)
Message copied by Monico Blitz on Fri Oct 09, 2012  1:06 PM ------      Message from: Eugenie Birks      Created: Fri Oct 09, 2012 11:22 AM      Contact: patient       Pt states someone called her she doesn't know who. ------

## 2012-10-09 NOTE — Telephone Encounter (Signed)
Brief Outpatient Oncology Nutrition Note  Patient has been identified to be at risk on malnutrition screen.  Called and spoke with patient's husband.  [facility] today.  Oran Rein, RD, LDN

## 2012-10-09 NOTE — Progress Notes (Signed)
CC:   Norton Blizzard, M.D.  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  CURRENT THERAPY:  Patient receiving palliative radiation therapy for her painful sternal metastasis.  INTERIM HISTORY:  Ms. Leber comes in for a followup.  She is half way through radiation.  She has had a really tough time.  She has had a steroid-induced myopathy.  She actually has seen a neurologist.  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  I do not know if the neurologist thought that the weakness in the legs might be a paraneoplastic type syndrome.  Of note, we did go ahead and actually get a biopsy from the actual lung lesion.  This is a left lower lobe lesion.  The biopsy was done on the 3rd of April.  The pathology report ([id number]) showed adenocarcinoma consistent with a lung primary.  We did the typical genetic studies.  She was wild type for EGFR, ALK, and ROS.  As such, she is going to need chemotherapy.  We did do an MRI of the brain.  This was without metastasis.  Because of all of her pain issues, we did go ahead and get her to Radiation Oncology.  They have started her radiation therapy.  She has a painful sternal metastasis.  She is responding well to this.  Of note, we did go ahead and give her an initial dose of Zometa when we first saw her.  Again, she really has been suffering from the steroid effects.  We are trying to taper her down as quickly as we can.  She is on 4 mg twice a day.  I told her to try to go down to 2 mg twice a day.  She did have some issues with the pain medication.  I did, I think, put her on some OxyContin, which she had a tough with.  She now is on oxycodone which she only takes 3 or 4 times a day.  Her appetite is improving.  I think when we first saw her, we did do a prealbumin, which was 19.2.  She has had no cough.  Her breathing is doing okay.  She is having no nausea or  vomiting.  There is no double vision or blurred vision.  She has had some leg swelling, likely from the steroids.  PHYSICAL EXAMINATION:  General:  This is a somewhat cushingoid-appearing white female in no obvious distress.  Vital signs:  Temperature of 98, pulse 83, respiratory rate 16, blood pressure 134/80.  Weight is 155. Head and neck:  Normocephalic, atraumatic skull.  There are no ocular or oral lesions.  She has no thrush.  No adenopathy is noted in the neck. Lungs:  Clear bilaterally.  There are no rales, wheezes, or rhonchi. Cardiac:  Regular rate and rhythm with a normal S1 and S2.  There are no murmurs, rubs, or bruits.  Abdomen:  Soft with good bowel sounds.  There is no palpable abdominal mass.  There is no fluid wave.  No palpable hepatosplenomegaly is noted.  Extremities:  Some trace edema in her lower legs.  There is some weakness in her thighs bilaterally. Neurological:  No focal neurological deficits.  LABORATORY STUDIES:  White cell count is 11.5, hemoglobin 12.8, hematocrit 38.4, platelet count 121.  Sodium is 135, potassium 4, BUN 18, creatinine 0.4.  Calcium is 8.9 with an albumin of 2.7.  Her prealbumin is 31.  IMPRESSION:  Ms. Seifer is a nice  67 year old white female with metastatic adenocarcinoma.  Again, we did get a biopsy of the primary lesion.  This was adenocarcinoma.  She does not have a genetic mutation such that we could use one of the new targeted drugs.  I just feel bad that she has had a tough time.  Her performance status is probably ECOG 1-2.  We might be looking at some steroid effect that could be causing the change in her performance status.  I spoke to her and her husband at length about systemic therapy.  Once she finishes radiation, i would probably wait a week or so and then see about systemic therapy.  She will need to have a Port-A-Cath placed.  We will have to get this set up.  I probably would consider her for  carboplatin/Alimta/Avastin.  I do not see any reason why she could not take Avastin.  We will plan to get treatment started in May.  I would do 2 cycles and then repeat her CT scan to see how she has responded.  I think we did give her information about the chemotherapy agents.  We will get the Port-A-Cath set up in a couple of weeks and then plan to start treatment after that.  We will get Ms. Deskin back to see [place] at the time of chemotherapy to make sure she is doing okay.    ______________________________ Josph Macho, M.D. PRE/MEDQ  D:  10/08/2012  T:  10/09/2012  Job:  [id number]

## 2012-10-09 NOTE — Telephone Encounter (Signed)
Pt aware of 5-8 chemo ed, port a cath on 5-12 to be NPO after midnight and needs a driver they are aware of 1-61 chemo.

## 2012-10-09 NOTE — Progress Notes (Signed)
This is a 67 year old female patient of Dr. Myna Hidalgo diagnosed with metastatic lung cancer receiving palliative radiation to the sternum. Plans are for chemotherapy.  Past medical history includes hypertension, hyperlipidemia, and Parkinson's disease.  Medications include calcium with vitamin D, Decadron, Zetia, multivitamin, Prilosec.  Labs include sodium 130, potassium 4.2, glucose 196, albumin 2.7 on April 24.  Height: 64 inches. Weight: 155.6 pounds Usual body weight: 174 pounds March 27. BMI: 26.7.  Patient reports somewhat of a poor appetite and taste alterations. She does not report nausea this time. Her mouth and throat are no longer sore. She is eating 3 meals a day which are prepared by her husband. She is not using a nutritional supplement at this time.  Nutrition diagnosis: Unintended weight loss related to diagnosis of metastatic lung cancer and associated treatments as evidenced by 11% weight loss over the last month.  Patient meets criteria for severe malnutrition in the context of chronic illness secondary to greater than 7-1/2% weight loss over the past 3 months and depletion of both fat and muscle on physical exam.  Intervention: I educated patient and husband on strategies for increasing oral intake using high-calorie, high-protein foods throughout the day. I have encouraged patient to explore oral nutrition supplements and provided samples for her to take with her today. I've also provided recipes for her to try. I've educated her on how to eat with taste alterations. I've encouraged her to monitor for nausea and take medication as needed. I provided fact sheets and contact information. Teach back method used.  Monitoring, evaluation, goals: Patient will tolerate increased calories and protein to minimize further weight loss and improve quality-of-life.  Next visit: Patient will contact me by telephone if needed.

## 2012-10-12 ENCOUNTER — Ambulatory Visit
Admission: RE | Admit: 2012-10-12 | Discharge: 2012-10-12 | Disposition: A | Discharge status: Home / Self Care | Payer: Medicare Other | Source: Ambulatory Visit | Attending: Radiation Oncology | Admitting: Radiation Oncology

## 2012-10-13 ENCOUNTER — Ambulatory Visit
Admission: RE | Admit: 2012-10-13 | Discharge: 2012-10-13 | Disposition: A | Discharge status: Home / Self Care | Payer: Medicare Other | Source: Ambulatory Visit | Attending: Radiation Oncology | Admitting: Radiation Oncology

## 2012-10-13 ENCOUNTER — Encounter: Payer: Self-pay | Admitting: Radiation Oncology

## 2012-10-13 ENCOUNTER — Telehealth: Payer: Self-pay | Admitting: *Deleted

## 2012-10-13 VITALS — BP 120/80 | HR 98 | Resp 20

## 2012-10-13 DIAGNOSIS — C7802 Secondary malignant neoplasm of left lung: Secondary | ICD-10-CM

## 2012-10-13 MED ORDER — FLUCONAZOLE 100 MG PO TABS: 100.0000 mg | ORAL_TABLET | Freq: Every day | ORAL | Status: DC

## 2012-10-13 MED ORDER — PROCHLORPERAZINE MALEATE 10 MG PO TABS: 10.0000 mg | ORAL_TABLET | Freq: Four times a day (QID) | ORAL | Status: DC | PRN

## 2012-10-13 MED ORDER — RADIAPLEXRX EX GEL
Freq: Once | CUTANEOUS | Status: AC
  Administered 2012-10-13: 16:00:00 via TOPICAL

## 2012-10-13 NOTE — Progress Notes (Signed)
Patient presents to the clinic today accompanied by her husband for PUT with Dr. Roselind Messier. Patient is alert and oriented to person, place, and time. No distress noted. Patient unable to stand for weight. Patient reports that she was exercising at home this morning and "something popped behind her right knee." Patient reports that the pain behind her right knee was very intense causing her to have to take pain medication. Patient reports that she phone Dr. Colin Benton to make him aware of this incident. Patient reports taking decadron 4 mg bid and steady tapering. Patient reports walking with walker yesterday. Worsening shortness of breath noted. No change in size of sternal nodule noted. No skin changes noted to anterior chest. Patient reports using radiaplex bid. Patient requesting antiemetic. Patient reports difficulty and painful swallowing have begun. Thrush noted. Moon face noted related to effects of decadron. Reported all findings to Dr. Roselind Messier.

## 2012-10-13 NOTE — Progress Notes (Signed)
[facility]    Radiation Oncology [address] [place]     Maryln Gottron, M.D. [place], [place] [address]               Billie Lade, M.D., Ph.D. Phone: [phone]      Molli Hazard A. Kathrynn Running, M.D. Fax: [phone]      Radene Gunning, M.D., Ph.D.         Lurline Hare, M.D.         Grayland Jack, M.D Weekly Treatment Management Note  Name: [name] Pacheco     MRN: [id number]        CSN: [id number] Date: 10/13/2012      DOB: [birth year]  CC: Norton Blizzard, MD         [place]    Status: Outpatient  Diagnosis: The encounter diagnosis was Metastatic lung carcinoma, left.  Current Dose: 27.5 Gy  Current Fraction: 11  Planned Dose: 37.5 Gy  Narrative: Eden Emms was seen today for weekly treatment management. The chart was checked and CBCT  were reviewed.  She is tolerating her treatments well. She has noticed some discomfort with swallowing. She also has a poor taste in her mouth.. She denies any pain in the sternum area at this time.  Her lower extremity weakness is somewhat better with tapering of steroids. She did see her neurologist who has recommended that MRI of the lumbar spine.  She has had some nausea this week and is requesting medication for this issue.  Review of patient's allergies indicates no known allergies. Current Outpatient Prescriptions  Medication Sig Dispense Refill  . Calcium Carbonate-Vitamin D (CALTRATE 600+D PO) Take 1 tablet by mouth 2 (two) times daily.      . Cholecalciferol (VITAMIN D) 2000 UNITS CAPS Take 1 capsule by mouth daily.      Marland Kitchen dexamethasone (DECADRON) 4 MG tablet Take 4 mg by mouth 2 (two) times daily.       Marland Kitchen ezetimibe (ZETIA) 10 MG tablet Take 10 mg by mouth every evening.      Marland Kitchen Lysine 500 MG TABS Take 500 mg by mouth as needed.       . metoprolol succinate (TOPROL-XL) 25 MG 24 hr tablet Take 25 mg by mouth daily.      . Multiple Vitamin (MULTIVITAMIN WITH MINERALS) TABS Take 1 tablet by mouth daily.      Marland Kitchen  omeprazole (PRILOSEC) 10 MG capsule Take 10 mg by mouth daily.      . OxyCODONE HCl, Abuse Deter, 5 MG TABA Take 5 tablets by mouth as directed.      . pramipexole (MIRAPEX) 1 MG tablet Take 1-1.5 mg by mouth 3 (three) times daily.      Marland Kitchen pyridostigmine (MESTINON) 60 MG tablet Take 1 tablet (60 mg total) by mouth 3 (three) times daily.  90 tablet  12  . valACYclovir (VALTREX) 500 MG tablet Take 1 tablet (500 mg total) by mouth 3 (three) times daily.  45 tablet  3  . zolpidem (AMBIEN CR) 6.25 MG CR tablet Take 6.25 mg by mouth at bedtime.      . fluconazole (DIFLUCAN) 100 MG tablet Take 1 tablet (100 mg total) by mouth daily.  8 tablet  0  . prochlorperazine (COMPAZINE) 10 MG tablet Take 1 tablet (10 mg total) by mouth every 6 (six) hours as needed.  30 tablet  0   No current facility-administered medications for this encounter.   Labs:  Lab Results  Component Value Date   WBC 11.5* 10/08/2012   HGB 12.8 10/08/2012   HCT 38.4 10/08/2012   MCV 86 10/08/2012   PLT 121* 10/08/2012   Lab Results  Component Value Date   CREATININE 0.4* 10/08/2012   BUN 18 10/08/2012   NA 135 10/08/2012   K 4.0 10/08/2012   CL 95* 10/08/2012   CO2 26 10/08/2012   Lab Results  Component Value Date   ALT 53 10/01/2012   AST 15 10/08/2012   BILITOT 0.80 10/08/2012    Physical Examination:  blood pressure is 120/80 and her pulse is 98. Her respiration is 20 and oxygen saturation is 97%.    Wt Readings from Last 3 Encounters:  10/08/12 155 lb (70.308 kg)  10/06/12 155 lb (70.308 kg)  10/06/12 155 lb 9.6 oz (70.58 kg)    The patient has a significant candidial infection in her mouth again.  The sternal mass appears to be softer and slightly smaller on exam today. There is some erythema in the treatment area. Lungs - Normal respiratory effort, chest expands symmetrically. Lungs are clear to auscultation, no crackles or wheezes.  Heart has regular rhythm and rate  Abdomen is soft and non tender with normal bowel  sounds  Assessment:  Patient tolerating treatments well  except for above issues  Plan: Continue treatment per original radiation prescription.  The patient will be restarted on a 7 day course of Diflucan. She will also be given Compazine for her nausea.

## 2012-10-14 ENCOUNTER — Ambulatory Visit
Admission: RE | Admit: 2012-10-14 | Discharge: 2012-10-14 | Disposition: A | Discharge status: Home / Self Care | Payer: Medicare Other | Source: Ambulatory Visit | Attending: Radiation Oncology | Admitting: Radiation Oncology

## 2012-10-15 ENCOUNTER — Ambulatory Visit
Admission: RE | Admit: 2012-10-15 | Discharge: 2012-10-15 | Disposition: A | Discharge status: Home / Self Care | Payer: Medicare Other | Source: Ambulatory Visit | Attending: Radiation Oncology | Admitting: Radiation Oncology

## 2012-10-15 NOTE — Telephone Encounter (Signed)
Pt called stating she fell approximately 2 weeks ago on her knee and probably injured it. This morning while in bed trying to elevate her legs she heard it pop.

## 2012-10-16 ENCOUNTER — Ambulatory Visit
Admission: RE | Admit: 2012-10-16 | Discharge: 2012-10-16 | Disposition: A | Discharge status: Home / Self Care | Payer: Medicare Other | Source: Ambulatory Visit | Attending: Radiation Oncology | Admitting: Radiation Oncology

## 2012-10-16 ENCOUNTER — Encounter (HOSPITAL_COMMUNITY): Payer: Self-pay | Admitting: Pharmacy Technician

## 2012-10-19 ENCOUNTER — Ambulatory Visit
Admission: RE | Admit: 2012-10-19 | Discharge: 2012-10-19 | Disposition: A | Discharge status: Home / Self Care | Payer: Medicare Other | Source: Ambulatory Visit | Attending: Radiation Oncology | Admitting: Radiation Oncology

## 2012-10-19 ENCOUNTER — Encounter: Payer: Self-pay | Admitting: Radiation Oncology

## 2012-10-19 VITALS — BP 130/92 | HR 99 | Temp 97.5°F | Resp 20 | Wt 154.9 lb

## 2012-10-19 DIAGNOSIS — C7802 Secondary malignant neoplasm of left lung: Secondary | ICD-10-CM

## 2012-10-19 NOTE — Progress Notes (Signed)
  Radiation Oncology         [phone] ________________________________  Name: [name] Pacheco MRN: [id number]  Date: 10/19/2012  DOB: [birth year]  Weekly Radiation Therapy Management  Current Dose: 37.5 Gy     Planned Dose:  37.5 Gy  Narrative . . . . . . . . The patient presents for routine under treatment assessment.                                                     The patient is without complaint. She is feeling the best she has since she started treatment                                 Set-up films were reviewed.                                 The chart was checked. Physical Findings. . .  weight is 154 lb 14.4 oz (70.262 kg). Her oral temperature is 97.5 F (36.4 C). Her blood pressure is 130/92 and her pulse is 99. Her respiration is 20 and oxygen saturation is 96%. . Weight essentially stable.  The upper chest wall mass seems softer and smaller on exam today.  No skin breakdown is appreciated to Impression . . . . . . . The patient is  tolerating radiation. Plan . . . . . . . . . . . . she will be scheduled for routine followup in one month. Patient will continue tapering of her Decadron.  ________________________________  -----------------------------------  Billie Lade, PhD, MD

## 2012-10-19 NOTE — Progress Notes (Signed)
Pt alert, oriented x 3. Pt denies pain, HA, nausea, unsteady gait, dizziness, blurred vision. She is taking Decadron 4 mg bid, asking if she can decrease to once daily. Pt continues to have BLE weakness, difficulty getting herself upright from sitting. Pt denies loss of appetite, cough, does have SOB w/exertion. She is applying Radiaplex to chest for hyperpigmentation, no desquamation. She completed tx today, has 1 month FU card.

## 2012-10-20 ENCOUNTER — Encounter: Payer: Self-pay | Admitting: *Deleted

## 2012-10-20 NOTE — Progress Notes (Signed)
[facility] Clinical Social Work  Clinical Social Work met with patient and spouse in [facility] support center to complete healthcare advance directives.  Patient designated spouse as healthcare power of attorney and completed living will.  CSW made copies of directives for patient and will send copy to medical records to be scanned into patient's chart.  To access directives: Go to Chart Review > Media Tab > titled under Advance Directives.   Lauren Mullis, MSW, LCSW Clinical Social Worker [facility] [phone]  

## 2012-10-21 ENCOUNTER — Telehealth: Payer: Self-pay | Admitting: *Deleted

## 2012-10-21 ENCOUNTER — Other Ambulatory Visit: Payer: Self-pay | Admitting: Radiology

## 2012-10-21 ENCOUNTER — Other Ambulatory Visit: Payer: Self-pay | Admitting: *Deleted

## 2012-10-21 DIAGNOSIS — C7802 Secondary malignant neoplasm of left lung: Secondary | ICD-10-CM

## 2012-10-21 MED ORDER — FOLIC ACID 1 MG PO TABS: 1.0000 mg | ORAL_TABLET | Freq: Every day | ORAL | Status: DC

## 2012-10-21 MED ORDER — LORAZEPAM 0.5 MG PO TABS: 0.5000 mg | ORAL_TABLET | Freq: Four times a day (QID) | ORAL | Status: DC | PRN

## 2012-10-21 MED ORDER — ONDANSETRON HCL 8 MG PO TABS: 8.0000 mg | ORAL_TABLET | Freq: Two times a day (BID) | ORAL | Status: DC

## 2012-10-21 NOTE — Telephone Encounter (Signed)
Left vm for Dr. Gustavo Lah nurse.

## 2012-10-21 NOTE — Telephone Encounter (Signed)
Spoke to pt regarding needing to start Folic Acid and B-12 injections. To receive 1st B-12 injection tomorrow after chemo class. Will also call in antiemetics needed post chemotherapy. She knows to place these to the side until she it is time for her tx. Is having trouble sleeping but doesn't want any further intervention at this time.

## 2012-10-21 NOTE — Telephone Encounter (Signed)
Pt aware of 5-8 inj

## 2012-10-21 NOTE — Telephone Encounter (Signed)
Pt aware of 5-8 inj after chemo education

## 2012-10-22 ENCOUNTER — Other Ambulatory Visit: Payer: Medicare Other

## 2012-10-22 ENCOUNTER — Encounter: Payer: Self-pay | Admitting: *Deleted

## 2012-10-22 ENCOUNTER — Ambulatory Visit (HOSPITAL_BASED_OUTPATIENT_CLINIC_OR_DEPARTMENT_OTHER): Payer: Medicare Other

## 2012-10-22 VITALS — BP 117/78 | HR 94 | Temp 98.5°F

## 2012-10-22 DIAGNOSIS — E538 Deficiency of other specified B group vitamins: Secondary | ICD-10-CM

## 2012-10-22 DIAGNOSIS — C3430 Malignant neoplasm of lower lobe, unspecified bronchus or lung: Secondary | ICD-10-CM

## 2012-10-22 DIAGNOSIS — C7802 Secondary malignant neoplasm of left lung: Secondary | ICD-10-CM

## 2012-10-22 MED ORDER — CYANOCOBALAMIN 1000 MCG/ML IJ SOLN
1000.0000 ug | INTRAMUSCULAR | Status: DC
  Administered 2012-10-22: 1000 ug via INTRAMUSCULAR

## 2012-10-22 NOTE — Patient Instructions (Addendum)

## 2012-10-26 ENCOUNTER — Other Ambulatory Visit: Payer: Self-pay | Admitting: Hematology & Oncology

## 2012-10-26 ENCOUNTER — Ambulatory Visit (HOSPITAL_COMMUNITY)
Admission: RE | Admit: 2012-10-26 | Discharge: 2012-10-26 | Disposition: A | Discharge status: Home / Self Care | Payer: Medicare Other | Source: Ambulatory Visit | Attending: Hematology & Oncology | Admitting: Hematology & Oncology

## 2012-10-26 ENCOUNTER — Encounter (HOSPITAL_COMMUNITY): Payer: Self-pay

## 2012-10-26 DIAGNOSIS — C3490 Malignant neoplasm of unspecified part of unspecified bronchus or lung: Secondary | ICD-10-CM | POA: Insufficient documentation

## 2012-10-26 DIAGNOSIS — E785 Hyperlipidemia, unspecified: Secondary | ICD-10-CM | POA: Insufficient documentation

## 2012-10-26 DIAGNOSIS — Z79899 Other long term (current) drug therapy: Secondary | ICD-10-CM | POA: Insufficient documentation

## 2012-10-26 DIAGNOSIS — I10 Essential (primary) hypertension: Secondary | ICD-10-CM | POA: Insufficient documentation

## 2012-10-26 DIAGNOSIS — Z923 Personal history of irradiation: Secondary | ICD-10-CM | POA: Insufficient documentation

## 2012-10-26 DIAGNOSIS — G20 Parkinson's disease: Secondary | ICD-10-CM | POA: Insufficient documentation

## 2012-10-26 DIAGNOSIS — G20A1 Parkinson's disease without dyskinesia, without mention of fluctuations: Secondary | ICD-10-CM | POA: Insufficient documentation

## 2012-10-26 LAB — CBC
HCT: 37.1 % (ref 36.0–46.0)
RDW: 16.1 % — ABNORMAL HIGH (ref 11.5–15.5)
WBC: 8.5 10*3/uL (ref 4.0–10.5)

## 2012-10-26 LAB — APTT: aPTT: 22 seconds — ABNORMAL LOW (ref 24–37)

## 2012-10-26 MED ORDER — SODIUM CHLORIDE 0.9 % IV SOLN: INTRAVENOUS | Status: DC

## 2012-10-26 MED ORDER — FENTANYL CITRATE 0.05 MG/ML IJ SOLN
INTRAMUSCULAR | Status: AC
  Filled 2012-10-26: qty 6

## 2012-10-26 MED ORDER — CEFAZOLIN SODIUM-DEXTROSE 2-3 GM-% IV SOLR
2.0000 g | INTRAVENOUS | Status: AC
  Administered 2012-10-26: 2 g via INTRAVENOUS
  Filled 2012-10-26: qty 50

## 2012-10-26 MED ORDER — MIDAZOLAM HCL 2 MG/2ML IJ SOLN
INTRAMUSCULAR | Status: AC | PRN
  Administered 2012-10-26: 1 mg via INTRAVENOUS
  Administered 2012-10-26 (×2): 0.5 mg via INTRAVENOUS

## 2012-10-26 MED ORDER — MIDAZOLAM HCL 2 MG/2ML IJ SOLN
INTRAMUSCULAR | Status: AC
  Filled 2012-10-26: qty 6

## 2012-10-26 MED ORDER — HEPARIN SOD (PORK) LOCK FLUSH 100 UNIT/ML IV SOLN
INTRAVENOUS | Status: DC | PRN
  Administered 2012-10-26: 500 [IU]

## 2012-10-26 MED ORDER — FENTANYL CITRATE 0.05 MG/ML IJ SOLN
INTRAMUSCULAR | Status: AC | PRN
  Administered 2012-10-26 (×2): 25 ug via INTRAVENOUS
  Administered 2012-10-26: 50 ug via INTRAVENOUS

## 2012-10-26 NOTE — H&P (Signed)
Chief Complaint: "I'm here for a port" Referring Physician:Ennever HPI: [name] Pacheco is an 67 y.o. female with metastatic lung cancer. She has had some radiation therapy and is now set to start chemotherapy. She is scheduled today for port placement. PMHx and meds reviewed. Pt feeling well, no recent fevers, illness. Her mobility is also a little better since her Decadron dose has been reduced.  Past Medical History:  Past Medical History  Diagnosis Date  . Hypertension   . Hyperlipidemia   . Parkinson disease   . Metastatic lung carcinoma 09/10/2012  . Shoulder pain   . Lung cancer     Past Surgical History:  Past Surgical History  Procedure Laterality Date  . Tonsillectomy    . Shoulder surgery Left 2012    arthroscopy, debridement    Family History:  Family History  Problem Relation Age of Onset  . Sudden death Mother   . Sudden death Father   . Heart attack Father     Social History:  reports that she quit smoking about 20 years ago. Her smoking use included Cigarettes. She smoked 0.00 packs per day for 10 years. She has never used smokeless tobacco. She reports that she does not drink alcohol or use illicit drugs.  Allergies: No Known Allergies  Medications: Calcium Carbonate-Vitamin D (CALTRATE 600+D PO) Sig - Route: Take 1 tablet by mouth 2 (two) times daily. - Oral Class: Historical Med Number of times this order has been changed since signing: 1 Order [address] Cholecalciferol (VITAMIN D) 2000 UNITS CAPS Sig - Route: Take 2,000 Units by mouth daily. - Oral Class: Historical Med Number of times this order has been changed since signing: 2 Order [address] dexamethasone (DECADRON) 4 MG tablet Sig - Route: Take 4 mg by mouth 2 (two) times daily. - Oral Class: Historical Med Number of times this order has been changed since signing: 3 Order [address] ezetimibe (ZETIA) 10 MG tablet Sig - Route: Take 10 mg by mouth every evening. - Oral Class: Historical Med Number of  times this order has been changed since signing: 1 Order [address] fluconazole (DIFLUCAN) 100 MG tablet 10/13/2012 Sig - Route: Take 100 mg by mouth daily. - Oral Class: Historical Med folic acid (FOLVITE) 1 MG tablet 100 tablet 3 10/21/2012 Sig - Route: Take 1 tablet (1 mg total) by mouth daily. Take daily starting 5-7 days before Alimta chemotherapy. Continue until 21 days after Alimta completed. - Oral Class: Print Non-formulary Exception Code: Dosage strength/form LORazepam (ATIVAN) 0.5 MG tablet 30 tablet 0 10/21/2012 Sig - Route: Take 1 tablet (0.5 mg total) by mouth every 6 (six) hours as needed (Nausea or vomiting). - Oral Class: Print Lysine 500 MG TABS Sig - Route: Take 500 mg by mouth daily as needed (out breaks). - Oral Class: Historical Med Number of times this order has been changed since signing: 3 Order [address] metoprolol succinate (TOPROL-XL) 25 MG 24 hr tablet Sig - Route: Take 25 mg by mouth at bedtime. - Oral Class: Historical Med Number of times this order has been changed since signing: 2 Order [address] Multiple Vitamin (MULTIVITAMIN WITH MINERALS) TABS Sig - Route: Take 1 tablet by mouth daily. - Oral Class: Historical Med Number of times this order has been changed since signing: 1 Order [address] omeprazole (PRILOSEC) 20 MG capsule Sig - Route: Take 20 mg by mouth daily. - Oral Class: Historical Med ondansetron (ZOFRAN) 8 MG tablet 30 tablet 1 10/21/2012 Sig -  Route: Take 1 tablet (8 mg total) by mouth 2 (two) times daily. Take two times a day starting the day after chemo for 3 days. Then take two times a day as needed for nausea or vomiting. - Oral Class: Print oxycodone (OXY-IR) 5 MG capsule Sig - Route: Take 5 mg by mouth 4 (four) times daily. - Oral Class: Historical Med pramipexole (MIRAPEX) 1 MG tablet Sig - Route: Take 1-1.5 mg by mouth 3 (three) times daily. 1 tablets bid and 1.5 tablets at bedtime - Oral Class: Historical Med Number of times this order has been changed since  signing: 1 Order [address] prochlorperazine (COMPAZINE) 10 MG tablet 10/13/2012 Sig - Route: Take 10 mg by mouth every 6 (six) hours as needed. Nausea - Oral Class: Historical Med pyridostigmine (MESTINON) 60 MG tablet 10/06/2012 Sig - Route: Take 60 mg by mouth 3 (three) times daily. - Oral Class: Historical Med valACYclovir (VALTREX) 500 MG tablet 09/29/2012 Class: Historical Med zolpidem (AMBIEN CR) 6.25 MG CR tablet Sig - Route: Take 6.25 mg by mouth at bedtime   Please HPI for pertinent positives, otherwise complete 10 system ROS negative.  Physical Exam: Blood pressure 112/74, pulse 79, temperature 98.4 F (36.9 C), temperature source Oral, resp. rate 20, height 5\' 4"  (1.626 m), weight 155 lb (70.308 kg), SpO2 100.00%. Body mass index is 26.59 kg/(m^2).   General Appearance:  Alert, cooperative, no distress, appears stated age  Head:  Normocephalic, without obvious abnormality, atraumatic  ENT: Unremarkable  Neck: Supple, symmetrical, trachea midline,   Lungs:   Clear to auscultation bilaterally, no w/r/r, respirations unlabored without use of accessory muscles.  Chest Wall:  No tenderness or deformity  Heart:  Regular rate and rhythm, S1, S2 normal, no murmur, rub or gallop.   Neurologic: Normal affect, no gross deficits.   No results found for this or any previous visit (from the past 48 hour(s)). No results found.  Assessment/Plan Metastatic lung cancer For Port placement today Explained procedure, risks, complications to pt and son. Labs pending Consent signed in chart  Brayton El PA-C 10/26/2012, 10:04 AM

## 2012-10-26 NOTE — Procedures (Signed)
Successful placement of right IJ approach port-a-cath with tip at the superior caval atrial junction. The catheter is ready for immediate use. No immediate post procedural complications. 

## 2012-10-27 ENCOUNTER — Other Ambulatory Visit: Payer: Self-pay | Admitting: Medical

## 2012-10-27 DIAGNOSIS — C7802 Secondary malignant neoplasm of left lung: Secondary | ICD-10-CM

## 2012-10-28 ENCOUNTER — Ambulatory Visit (HOSPITAL_BASED_OUTPATIENT_CLINIC_OR_DEPARTMENT_OTHER): Payer: Medicare Other

## 2012-10-28 ENCOUNTER — Ambulatory Visit (HOSPITAL_BASED_OUTPATIENT_CLINIC_OR_DEPARTMENT_OTHER): Payer: Medicare Other | Admitting: Medical

## 2012-10-28 ENCOUNTER — Ambulatory Visit (HOSPITAL_BASED_OUTPATIENT_CLINIC_OR_DEPARTMENT_OTHER): Payer: Medicare Other | Admitting: Lab

## 2012-10-28 DIAGNOSIS — C7951 Secondary malignant neoplasm of bone: Secondary | ICD-10-CM

## 2012-10-28 DIAGNOSIS — C7802 Secondary malignant neoplasm of left lung: Secondary | ICD-10-CM

## 2012-10-28 DIAGNOSIS — R5383 Other fatigue: Secondary | ICD-10-CM

## 2012-10-28 DIAGNOSIS — Z5111 Encounter for antineoplastic chemotherapy: Secondary | ICD-10-CM

## 2012-10-28 DIAGNOSIS — C3430 Malignant neoplasm of lower lobe, unspecified bronchus or lung: Secondary | ICD-10-CM

## 2012-10-28 DIAGNOSIS — D696 Thrombocytopenia, unspecified: Secondary | ICD-10-CM

## 2012-10-28 LAB — COMPREHENSIVE METABOLIC PANEL
ALT: 49 U/L — ABNORMAL HIGH (ref 0–35)
Alkaline Phosphatase: 64 U/L (ref 39–117)
CO2: 26 mEq/L (ref 19–32)
Creatinine, Ser: 0.34 mg/dL — ABNORMAL LOW (ref 0.50–1.10)
Sodium: 139 mEq/L (ref 135–145)
Total Bilirubin: 0.4 mg/dL (ref 0.3–1.2)
Total Protein: 5.7 g/dL — ABNORMAL LOW (ref 6.0–8.3)

## 2012-10-28 LAB — CBC WITH DIFFERENTIAL (CANCER CENTER ONLY)
BASO%: 0.2 % (ref 0.0–2.0)
HCT: 37.9 % (ref 34.8–46.6)
LYMPH%: 8.1 % — ABNORMAL LOW (ref 14.0–48.0)
MCH: 29.6 pg (ref 26.0–34.0)
MCHC: 33 g/dL (ref 32.0–36.0)
MCV: 90 fL (ref 81–101)
MONO#: 0.6 10*3/uL (ref 0.1–0.9)
MONO%: 6.6 % (ref 0.0–13.0)
NEUT%: 85 % — ABNORMAL HIGH (ref 39.6–80.0)
Platelets: 90 10*3/uL — ABNORMAL LOW (ref 145–400)
RDW: 16.8 % — ABNORMAL HIGH (ref 11.1–15.7)
WBC: 9.3 10*3/uL (ref 3.9–10.0)

## 2012-10-28 MED ORDER — SODIUM CHLORIDE 0.9 % IV SOLN
500.0000 mg | Freq: Once | INTRAVENOUS | Status: AC
  Administered 2012-10-28: 500 mg via INTRAVENOUS
  Filled 2012-10-28: qty 50

## 2012-10-28 MED ORDER — SODIUM CHLORIDE 0.9 % IV SOLN: Freq: Once | INTRAVENOUS | Status: DC

## 2012-10-28 MED ORDER — HEPARIN SOD (PORK) LOCK FLUSH 100 UNIT/ML IV SOLN
250.0000 [IU] | Freq: Once | INTRAVENOUS | Status: DC | PRN
  Filled 2012-10-28: qty 5

## 2012-10-28 MED ORDER — SODIUM CHLORIDE 0.9 % IV SOLN
Freq: Once | INTRAVENOUS | Status: AC
  Administered 2012-10-28: 11:00:00 via INTRAVENOUS

## 2012-10-28 MED ORDER — HEPARIN SOD (PORK) LOCK FLUSH 100 UNIT/ML IV SOLN
500.0000 [IU] | Freq: Once | INTRAVENOUS | Status: AC | PRN
  Administered 2012-10-28: 500 [IU]
  Filled 2012-10-28: qty 5

## 2012-10-28 MED ORDER — ALTEPLASE 2 MG IJ SOLR
2.0000 mg | Freq: Once | INTRAMUSCULAR | Status: DC | PRN
  Filled 2012-10-28: qty 2

## 2012-10-28 MED ORDER — SODIUM CHLORIDE 0.9 % IJ SOLN
10.0000 mL | INTRAMUSCULAR | Status: DC | PRN
  Filled 2012-10-28: qty 10

## 2012-10-28 MED ORDER — SODIUM CHLORIDE 0.9 % IJ SOLN
3.0000 mL | INTRAMUSCULAR | Status: DC | PRN
  Filled 2012-10-28: qty 10

## 2012-10-28 MED ORDER — SODIUM CHLORIDE 0.9 % IV SOLN
400.0000 mg/m2 | Freq: Once | INTRAVENOUS | Status: AC
  Administered 2012-10-28: 700 mg via INTRAVENOUS
  Filled 2012-10-28: qty 28

## 2012-10-28 MED ORDER — DEXAMETHASONE SODIUM PHOSPHATE 20 MG/5ML IJ SOLN
20.0000 mg | Freq: Once | INTRAMUSCULAR | Status: AC
  Administered 2012-10-28: 20 mg via INTRAVENOUS
  Filled 2012-10-28: qty 5

## 2012-10-28 MED ORDER — SODIUM CHLORIDE 0.9 % IJ SOLN
10.0000 mL | INTRAMUSCULAR | Status: DC | PRN
  Administered 2012-10-28: 10 mL
  Filled 2012-10-28: qty 10

## 2012-10-28 MED ORDER — ZOLEDRONIC ACID 4 MG/100ML IV SOLN
4.0000 mg | Freq: Once | INTRAVENOUS | Status: AC
  Administered 2012-10-28: 4 mg via INTRAVENOUS
  Filled 2012-10-28: qty 100

## 2012-10-28 MED ORDER — ONDANSETRON 16 MG/50ML IVPB (CHCC)
16.0000 mg | Freq: Once | INTRAVENOUS | Status: AC
  Administered 2012-10-28: 16 mg via INTRAVENOUS

## 2012-10-28 NOTE — Patient Instructions (Addendum)
[facility] Discharge Instructions for Patients Receiving Chemotherapy   Today you received the following chemotherapy agents Alimta and Carboplatin   To help prevent nausea and vomiting after your treatment, we encourage you to take your nausea medication:  Zofran 8 mg - 1 tablet twice a day x 3 days starting the day after chemo then twice a day as needed for nausea or vomiting.  Ativan 0.5 mg - 1 tablet by mouth or under your tongue every 6 hours as needed for nausea or vomiting    Compazine 10 mg - 1 tab every 6 hours as needed for nausea or vomiting   If you develop nausea and vomiting that is not controlled by your nausea medication, call the clinic. If it is after clinic hours your family physician or the after hours number for the clinic or go to the Emergency Department.   BELOW ARE SYMPTOMS THAT SHOULD BE REPORTED IMMEDIATELY:  *FEVER GREATER THAN 100.5 F  *CHILLS WITH OR WITHOUT FEVER  NAUSEA AND VOMITING THAT IS NOT CONTROLLED WITH YOUR NAUSEA MEDICATION  *UNUSUAL SHORTNESS OF BREATH  *UNUSUAL BRUISING OR BLEEDING  TENDERNESS IN MOUTH AND THROAT WITH OR WITHOUT PRESENCE OF ULCERS  *URINARY PROBLEMS  *BOWEL PROBLEMS  UNUSUAL RASH Items with * indicate a potential emergency and should be followed up as soon as possible.   One of the nurses will contact you 24 hours after your treatment. Please let the nurse know about any problems that you may have experienced. Feel free to call the clinic you have any questions or concerns. The clinic phone number is [phone].   I have been informed and understand all the instructions given to me. I know to contact the clinic, my physician, or go to the Emergency Department if any problems should occur. I do not have any questions at this time, but understand that I may call the clinic during office hours   should I have any questions or need assistance in obtaining follow up  care.    __________________________________________  _____________  __________ Signature of Patient or Authorized Representative            Date                   Time    __________________________________________ Nurse's Signature

## 2012-10-28 NOTE — Progress Notes (Addendum)
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  PAST THERAPY:  Patient is s/p palliative radiation therapy for her painful sternal metastasis.  CURRENT THERAPY: #1.  She is to start her first cycle of Alimta/carboplatin/Avastin every 21 days (Avastin will be started 2nd cycle). #2.  Zometa 4 mg IV every 3 weeks.  INTERIM HISTORY: [name] Pacheco presents today for an office followup visit. She just had a Port-A-Cath placed on 10/26/2012.  This went well without any complications.  She is now completed her radiation therapy for painful sternal metastasis.  She states, she no longer has any pain.  We have been tapering down her Decadron.  She is now on 2 mg daily.  This has helped with a cushingoid appearance.  Significantly.  This has also helped improve some of her weakness.  The last, time, she was here.  She was in a wheelchair, and very weak.  She is now able to walk with a rolling walker.  I do think some home physical therapy would help as well.  She is following up with the neurologist.  Of note, we did get an MRI of the brain, which revealed no metastasis.  She is here today to start her first cycle of carboplatin/Alimta/Avastin.  We are holding off on the Avastin until her second cycle secondary to her recent port placement.  We will continue to give her Zometa every 3-4 weeks.  She states her appetite has improved.  She is eating much better.  She's not having any nausea, vomiting, diarrhea, constipation, any chest pain, shortness of breath, or cough.  She denies any fevers, chills, or night sweats.  She denies any lower leg swelling, any abdominal pain.  She denies any obvious, or abnormal bleeding.  She denies any headaches, or rashes.  She has noticed since her radiation therapy, that she does have a "film" over her eyes sometimes.  This could possibly be secondary to the steroids. I did advise her to get some normal tear eyedrops.  I also told her if this should  continue she needs to followup with her ophthalmologist.  Overall, she is doing quite well. Her ECOG performance status is a 1.   Review of Systems: Constitutional:Negative for malaise/fatigue, fever, chills, weight loss, diaphoresis, activity change, appetite change, and unexpected weight change.  HEENT: Negative for double vision, blurred vision, visual loss, ear pain, tinnitus, congestion, rhinorrhea, epistaxis sore throat or sinus disease, oral pain/lesion, tongue soreness Respiratory: Negative for cough, chest tightness, shortness of breath, wheezing and stridor.  Cardiovascular: Negative for chest pain, palpitations, leg swelling, orthopnea, PND, DOE or claudication Gastrointestinal: Negative for nausea, vomiting, abdominal pain, diarrhea, constipation, blood in stool, melena, hematochezia, abdominal distention, anal bleeding, rectal pain, anorexia and hematemesis.  Genitourinary: Negative for dysuria, frequency, hematuria,  Musculoskeletal: Negative for myalgias, back pain, joint swelling, arthralgias and gait problem.  Skin: Negative for rash, color change, pallor and wound.  Neurological:. Negative for dizziness/light-headedness, tremors, seizures, syncope, facial asymmetry, speech difficulty, weakness, numbness, headaches and paresthesias.  Hematological: Negative for adenopathy. Does not bruise/bleed easily.  Psychiatric/Behavioral:  Negative for depression, no loss of interest in normal activity or change in sleep pattern.   Physical Exam: This is a pleasant, somewhat, 67 year old, cushingoid, white female, in no obvious distress Vitals: Temperature 98.2 degrees, pulse 83, respirations 16, blood pressure 117/72.  Weight 156 pounds HEENT reveals a normocephalic, atraumatic skull, no scleral icterus, no oral lesions  Neck is supple without any cervical or supraclavicular adenopathy.  Lungs  are clear to auscultation bilaterally. There are no wheezes, rales or rhonci Cardiac is regular  rate and rhythm with a normal S1 and S2. There are no murmurs, rubs, or bruits.  Abdomen is soft with good bowel sounds, there is no palpable mass. There is no palpable hepatosplenomegaly. There is no palpable fluid wave.  Musculoskeletal no tenderness of the spine, ribs, or hips.  Extremities there are no clubbing, cyanosis, or edema.  Skin no petechia, purpura or ecchymosis Neurologic is nonfocal.  Laboratory Data: White count 9.3, hemoglobin 12.5, hematocrit 37.9, platelets 90,000  Current Outpatient Prescriptions on File Prior to Visit  Medication Sig Dispense Refill  . Calcium Carbonate-Vitamin D (CALTRATE 600+D PO) Take 1 tablet by mouth 2 (two) times daily.      . Cholecalciferol (VITAMIN D) 2000 UNITS CAPS Take 2,000 Units by mouth daily.       Marland Kitchen dexamethasone (DECADRON) 4 MG tablet Take 4 mg by mouth 2 (two) times daily.       Marland Kitchen ezetimibe (ZETIA) 10 MG tablet Take 10 mg by mouth every evening.      . fluconazole (DIFLUCAN) 100 MG tablet Take 100 mg by mouth daily.      . folic acid (FOLVITE) 1 MG tablet Take 1 tablet (1 mg total) by mouth daily. Take daily starting 5-7 days before Alimta chemotherapy. Continue until 21 days after Alimta completed.  100 tablet  3  . LORazepam (ATIVAN) 0.5 MG tablet Take 1 tablet (0.5 mg total) by mouth every 6 (six) hours as needed (Nausea or vomiting).  30 tablet  0  . Lysine 500 MG TABS Take 500 mg by mouth daily as needed (out breaks).       . metoprolol succinate (TOPROL-XL) 25 MG 24 hr tablet Take 25 mg by mouth at bedtime.       . Multiple Vitamin (MULTIVITAMIN WITH MINERALS) TABS Take 1 tablet by mouth daily.      Marland Kitchen omeprazole (PRILOSEC) 20 MG capsule Take 20 mg by mouth daily.      . ondansetron (ZOFRAN) 8 MG tablet Take 1 tablet (8 mg total) by mouth 2 (two) times daily. Take two times a day starting the day after chemo for 3 days. Then take two times a day as needed for nausea or vomiting.  30 tablet  1  . oxycodone (OXY-IR) 5 MG capsule  Take 5 mg by mouth 4 (four) times daily.      . pramipexole (MIRAPEX) 1 MG tablet Take 1-1.5 mg by mouth 3 (three) times daily. 1 tablets bid and 1.5 tablets at bedtime      . prochlorperazine (COMPAZINE) 10 MG tablet Take 10 mg by mouth every 6 (six) hours as needed. Nausea      . pyridostigmine (MESTINON) 60 MG tablet Take 60 mg by mouth 3 (three) times daily.      . valACYclovir (VALTREX) 500 MG tablet       . zolpidem (AMBIEN CR) 6.25 MG CR tablet Take 6.25 mg by mouth at bedtime.       No current facility-administered medications on file prior to visit.   Assessment/Plan: This is a pleasant, 67 year old, white female, with the following issues:  #1.  Metastatic adenocarcinoma of the lung.  She does not have any genetic mutations such that we could use one of the new targeted drugs.  She is to start her first cycle of Alimta/carboplatin/Avastin today.  Again, her, Avastin will not start until the second cycle secondary to  her recent port placement.  The overall plan, is to do 2 cycles and then repeat a CT scan to see how she has responded.  #2.  Port-A-Cath. There's been no complications.  #3.  Supportive therapy.  She will continue on Zometa every 3-4 weeks.  #4.  Anti-emetics.  She does have Zofran and Compazine if needed.  #5.  Cushingoid appearance./Weakness.  This is likely secondary to the, steroids.  She is on 2 mg of Decadron daily.  We will continue to taper, as needed.  #6.  Thrombocytopenia.  Most likely secondary to malignancy, and possibly radiation therapy.  She has no active bleeding.  We will continue with today's chemotherapy.  #7.  Followup.  We will follow back up with [name] Pacheco  in 3 weeks, but before then should there be questions or concerns.

## 2012-10-29 ENCOUNTER — Telehealth: Payer: Self-pay | Admitting: Oncology

## 2012-10-29 ENCOUNTER — Encounter: Payer: Self-pay | Admitting: Hematology & Oncology

## 2012-10-29 ENCOUNTER — Encounter: Payer: Self-pay | Admitting: *Deleted

## 2012-10-29 ENCOUNTER — Encounter: Payer: Self-pay | Admitting: Oncology

## 2012-10-29 NOTE — Progress Notes (Signed)
24 Hour Chemotherapy Follow up Call  [name] Pacheco B Lasch called at home following administration of  chemotherapy. No complaints.   Following interventions recommended - none  Reviewed all post chemotherapy instructions with patient and when should call clinic or MD.  Patient verbalized understanding.

## 2012-10-29 NOTE — Telephone Encounter (Signed)
See documentation note

## 2012-10-29 NOTE — Progress Notes (Signed)
Wrong encounter. 

## 2012-10-30 ENCOUNTER — Other Ambulatory Visit: Payer: Self-pay | Admitting: *Deleted

## 2012-10-30 ENCOUNTER — Encounter: Payer: Self-pay | Admitting: Hematology & Oncology

## 2012-10-30 DIAGNOSIS — R5381 Other malaise: Secondary | ICD-10-CM

## 2012-10-30 DIAGNOSIS — C7800 Secondary malignant neoplasm of unspecified lung: Secondary | ICD-10-CM

## 2012-10-30 NOTE — Progress Notes (Signed)
Pt sent an email stating: "Wanted to check to see if the home physical therapy had been ordered for me? Would like to use [facility]. husband is currently using them as well. They will come from [facility]. Thanks, Aurelie"  Will send referral to [facility].

## 2012-11-03 ENCOUNTER — Encounter: Payer: Self-pay | Admitting: Hematology & Oncology

## 2012-11-04 ENCOUNTER — Other Ambulatory Visit: Payer: Self-pay | Admitting: *Deleted

## 2012-11-04 ENCOUNTER — Encounter: Payer: Self-pay | Admitting: Radiation Oncology

## 2012-11-04 DIAGNOSIS — C7802 Secondary malignant neoplasm of left lung: Secondary | ICD-10-CM

## 2012-11-04 MED ORDER — FLUCONAZOLE 100 MG PO TABS: 100.0000 mg | ORAL_TABLET | Freq: Every day | ORAL | Status: DC

## 2012-11-04 NOTE — Progress Notes (Signed)
  Radiation Oncology         [phone] ________________________________  Name: [name] Pacheco MRN: [id number]  Date: 11/04/2012  DOB: Aug 18, 1945 End of Treatment Note  Diagnosis:   Stage IV non-small cell lung cancer   Indication for treatment:  Painful osseous and soft tissue metastasis       Radiation treatment dates:   09/29/12 - 10/19/12  Site/dose:   The sternal lesion received 15 treatments to a cumulative dose of 37.5 Gy                          The primary site in the left lower lung area  received 37.5 Gy in 15 fractions    Beams/energy:   AP/PA to both areas, 10x/6x photon beams  Narrative: The patient tolerated radiation treatment relatively well.   She had good improvement in her sternal pain  Plan: The patient has completed radiation treatment. The patient will return to radiation oncology clinic for routine followup in one month. I advised them to call or return sooner if they have any questions or concerns related to their recovery or treatment.  -----------------------------------  Billie Lade, PhD, MD

## 2012-11-04 NOTE — Telephone Encounter (Signed)
Received an email message via MyChart from the pt stating:  "I now have another flare up of a yeast infection in my mouth and throat. Throat and esphoaheaus is sore and very nasty. Have been taking the maintenance drug everyday (Valtrex I think) but since the chemo on 10/28/2012 it (throat) has steadily gptten worse and is not going away. Can I get another round of the one I took before? Maybe Fluconazole I think".  Reviewed with Dr Myna Hidalgo. To stay on Diflucan 100 mg continuously until she finishes chemo. Rx sent via e-rx to the pharmacy on file.

## 2012-11-05 ENCOUNTER — Encounter: Payer: Self-pay | Admitting: Neurology

## 2012-11-05 ENCOUNTER — Telehealth: Payer: Self-pay | Admitting: *Deleted

## 2012-11-05 DIAGNOSIS — G479 Sleep disorder, unspecified: Secondary | ICD-10-CM

## 2012-11-05 NOTE — Telephone Encounter (Signed)
Received a call from [facility] PT. She called to report a temp of 100.8 P 100 BP 92/66. Pt states normal BP is around 117/80s. She was not aware of having a fever prior to her coming. Denies any other s/s of infection. Says throat feels better today. Reviewed with Dr Myna Hidalgo. To stop taking Metoprolol and to monitor fever for now. To call if it worsens or if she starts to feel like she is getting an infection. She verbalized understanding.

## 2012-11-06 ENCOUNTER — Ambulatory Visit (INDEPENDENT_AMBULATORY_CARE_PROVIDER_SITE_OTHER): Payer: Medicare Other | Admitting: Neurology

## 2012-11-06 DIAGNOSIS — G20 Parkinson's disease: Secondary | ICD-10-CM

## 2012-11-06 DIAGNOSIS — C3490 Malignant neoplasm of unspecified part of unspecified bronchus or lung: Secondary | ICD-10-CM

## 2012-11-06 DIAGNOSIS — R531 Weakness: Secondary | ICD-10-CM | POA: Insufficient documentation

## 2012-11-06 DIAGNOSIS — R5381 Other malaise: Secondary | ICD-10-CM

## 2012-11-06 DIAGNOSIS — G20A1 Parkinson's disease without dyskinesia, without mention of fluctuations: Secondary | ICD-10-CM

## 2012-11-06 NOTE — Progress Notes (Signed)
HPI:  She has been followed here by Dr. Thad Ranger  since July 2008 for possible Parkinson's disease.  She has PMHx of HTN, Hyperlipidemia, she has gradual onset left-handed tremor. She has taken Mirapex with good relief, and that she has restless leg symptoms, responding well to Mirapex,  She denied REM sleep disorder, she does snore, denies excessive daytime drowsiness. She has gradual worsening sense of smell for few years, she denies gait difficulty, no new neurological issues.   She developed subacute onset of bilateral lower extremity weakness, gait difficulty, the point of difficulty getting up from chairs  in early April 2014, also noticed a bump in her chest, left shoulder pain, radiating pain to her left arm, CT of the chest showed lytic lesion of the manubrium with probable extension into the nearby pectoralis muscle attachment, large mass- like opacity in the medial basal segment of the left lower lobe,  and scattered small indistinct pulmonary lesions in all pulmonary lobes, represents bronchogenic carcinoma with metastatic disease, but the absence of any hilar or mediastinal lymphadenopathy is unusual.   Biopsy consistent with pulmonary adenocarcinoma, she was given dexamethasone 4 mg 8 tablets a day, over the past 1-2 weeks, she reported marked improvement, now tapering down to 2 tablets twice a day, she continues to have gait difficulty, difficulty bearing weight at her best she can walk with a walker,She needs assistant to transfer herself, she also has mild bilateral upper extremity weakness, she has dry mouth, she denies double vision, no swallowing difficulty. She denies sensory changes.  She is going through radiation therapy, will followed by chemotherapy.   UPDATE Nov 06 2012:   She is overall doing better, denies significant low back pain, MRI of the lumbar was not done, worry about the co-pay, her husband is at the hospital gastric bleeding,  She is on tapering dose of  dexamethasone, 4 mg half tablets a day, she is tolerating Mestinon, there was no significant side effect  voltage-gated calcium channel antibody was negative,..    Physical Exam  General: dry mouth, sitting in wheelchair Neck: supple no carotid bruits Respiratory: clear to auscultation bilaterally Cardiovascular: regular rate rhythm  Neurologic Exam  Mental Status: pleasant, awake, alert, cooperative to history, talking, and casual conversation. Cranial Nerves: CN II-XII pupils were equal round reactive to light.  Fundi were sharp bilaterally.  Extraocular movements were full.  Visual fields were full on confrontational test.  Facial sensation and strength were normal.  Hearing was intact to finger rubbing bilaterally.  Uvula tongue were midline.  Head turning and shoulder shrugging were normal and symmetric.  Tongue protrusion into the cheeks strength were normal. Narrow oropharyngeal.  Motor: shoulder abduction 5/5, external rotation 5/5, elbow flexion 5/5, elbow extension 5/5, wrist flexion 5/4, wrist extension 5/4, hip flexion 4 4, knee flexion 4/4, knee extension 5 5, ankle dorsiflexion 4 4, ankle plantar flexion 5 5  Sensory: Normal to light touch, pinprick, proprioception, and vibratory sensation. Coordination: Normal finger-to-nose.There was no dysmetria noticed. Gait and Station: She was able to push on chair arm, mild unsteady cautious gait,  Reflexes: areflexia. Plantar responses are flexor.   Assessment and Plan:  Subacute onset of bilateral lower extremity weakness, proximal upper extremity weakness, no sensory loss, dry mouth no bulbar weakness, in the setting of metastatic lung adenocarcinoma, large dose steroid treatment  1. voltage-gated calcium channel antibody was negative, she has improvement in her symptoms, less supportive of Lambert-Eaton  Syndrome other possibility also including steroid induced myopathy, deconditioning,  bilateral lumbar radiculopathies, 2 we decided  to hold off further evaluation of this point 3 continue physical therapy return to clinic in 2 3 months

## 2012-11-10 ENCOUNTER — Other Ambulatory Visit: Payer: Self-pay | Admitting: *Deleted

## 2012-11-10 ENCOUNTER — Telehealth: Payer: Self-pay | Admitting: Hematology & Oncology

## 2012-11-10 DIAGNOSIS — R509 Fever, unspecified: Secondary | ICD-10-CM

## 2012-11-10 DIAGNOSIS — C7800 Secondary malignant neoplasm of unspecified lung: Secondary | ICD-10-CM

## 2012-11-10 NOTE — Telephone Encounter (Signed)
Per Amy to sch lab/Tracy for 11/11/12.  Patient sch apt for 1:15 tomrrow

## 2012-11-11 ENCOUNTER — Other Ambulatory Visit (HOSPITAL_BASED_OUTPATIENT_CLINIC_OR_DEPARTMENT_OTHER): Payer: Medicare Other | Admitting: Lab

## 2012-11-11 ENCOUNTER — Ambulatory Visit (HOSPITAL_BASED_OUTPATIENT_CLINIC_OR_DEPARTMENT_OTHER)
Admission: RE | Admit: 2012-11-11 | Discharge: 2012-11-11 | Disposition: A | Discharge status: Home / Self Care | Payer: Medicare Other | Source: Ambulatory Visit | Attending: Medical | Admitting: Medical

## 2012-11-11 ENCOUNTER — Ambulatory Visit (HOSPITAL_BASED_OUTPATIENT_CLINIC_OR_DEPARTMENT_OTHER): Payer: Medicare Other | Admitting: Medical

## 2012-11-11 VITALS — BP 111/75 | HR 119 | Temp 98.2°F | Resp 16 | Ht 63.0 in | Wt 158.0 lb

## 2012-11-11 DIAGNOSIS — R509 Fever, unspecified: Secondary | ICD-10-CM

## 2012-11-11 DIAGNOSIS — I10 Essential (primary) hypertension: Secondary | ICD-10-CM | POA: Insufficient documentation

## 2012-11-11 DIAGNOSIS — R059 Cough, unspecified: Secondary | ICD-10-CM | POA: Insufficient documentation

## 2012-11-11 DIAGNOSIS — R05 Cough: Secondary | ICD-10-CM

## 2012-11-11 DIAGNOSIS — C7800 Secondary malignant neoplasm of unspecified lung: Secondary | ICD-10-CM

## 2012-11-11 DIAGNOSIS — C3430 Malignant neoplasm of lower lobe, unspecified bronchus or lung: Secondary | ICD-10-CM

## 2012-11-11 DIAGNOSIS — R0602 Shortness of breath: Secondary | ICD-10-CM | POA: Insufficient documentation

## 2012-11-11 DIAGNOSIS — C3490 Malignant neoplasm of unspecified part of unspecified bronchus or lung: Secondary | ICD-10-CM | POA: Insufficient documentation

## 2012-11-11 DIAGNOSIS — R5383 Other fatigue: Secondary | ICD-10-CM

## 2012-11-11 LAB — CBC WITH DIFFERENTIAL (CANCER CENTER ONLY)
BASO#: 0 10*3/uL (ref 0.0–0.2)
Eosinophils Absolute: 0 10*3/uL (ref 0.0–0.5)
HGB: 9.7 g/dL — ABNORMAL LOW (ref 11.6–15.9)
LYMPH%: 11.8 % — ABNORMAL LOW (ref 14.0–48.0)
MCV: 90 fL (ref 81–101)
MONO#: 0.8 10*3/uL (ref 0.1–0.9)
NEUT#: 5.7 10*3/uL (ref 1.5–6.5)
Platelets: 196 10*3/uL (ref 145–400)
RBC: 3.28 10*6/uL — ABNORMAL LOW (ref 3.70–5.32)
WBC: 7.4 10*3/uL (ref 3.9–10.0)

## 2012-11-11 LAB — TECHNOLOGIST REVIEW CHCC SATELLITE

## 2012-11-11 MED ORDER — MOXIFLOXACIN HCL 400 MG PO TABS: 400.0000 mg | ORAL_TABLET | Freq: Every day | ORAL | Status: DC

## 2012-11-11 MED ORDER — MAGIC MOUTHWASH: 15.0000 mL | Freq: Three times a day (TID) | ORAL | Status: DC | PRN

## 2012-11-11 MED ORDER — BENZONATATE 200 MG PO CAPS: 200.0000 mg | ORAL_CAPSULE | Freq: Three times a day (TID) | ORAL | Status: DC | PRN

## 2012-11-11 NOTE — Progress Notes (Signed)
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  PAST THERAPY:  Patient is s/p palliative radiation therapy for her painful sternal metastasis.  CURRENT THERAPY: #1.  She is s/p first cycle of Alimta/carboplatin/Avastin every 21 days (Avastin will be started 2nd cycle). #2.  Zometa 4 mg IV every 3 weeks.  INTERIM HISTORY: Ms. Fesperman presents today as a work in.  She states that over the weekend he started to spike a temperature.  Her temperature got up to 101.  He also reports some he does report some dyspnea on exertion, especially with physical therapy he states that he recently started having a cough she does have some production of sputum with his, however it is clear.  She does not report any chest pain.  She does not report shortness of breath at rest.  She does have some mild swelling laterally her ankles.  She does not report any calf swelling tenderness or palpable cord.  She actually reports a decent appetite she denies any nausea, vomiting, diarrhea, or constipation.  She denies any chills or night sweats.  She denies any abdominal pain.  She denies any obvious or abnormal bleeding.  She denies any headaches visual changes or rashes.  She does state that she has a few ulcerations at the top of her hard palate.  She was on Valtrex and switched over to Diflucan. Her thursh has resolved.  I'm going to give her prescription for Magic mouthwash.  She has completed one cycle of Alimta/carboplatin back on May 14.  We are going to add Avastin to the regimen starting with her second cycle.  Just to be on the safe side, and going to go ahead and send her down for a chest x-ray.  I am concerned with spike in temperature and a cough that she may have some form of infection.  Review of Systems: Constitutional:Negative for malaise/fatigue, fever, chills, weight loss, diaphoresis, activity change, appetite change, and unexpected weight change.  HEENT: Negative for double vision,  blurred vision, visual loss, ear pain, tinnitus, congestion, rhinorrhea, epistaxis sore throat or sinus disease, oral pain/lesion, tongue soreness Respiratory: Negative for cough, chest tightness, shortness of breath, wheezing and stridor.  Cardiovascular: Negative for chest pain, palpitations, leg swelling, orthopnea, PND, DOE or claudication Gastrointestinal: Negative for nausea, vomiting, abdominal pain, diarrhea, constipation, blood in stool, melena, hematochezia, abdominal distention, anal bleeding, rectal pain, anorexia and hematemesis.  Genitourinary: Negative for dysuria, frequency, hematuria,  Musculoskeletal: Negative for myalgias, back pain, joint swelling, arthralgias and gait problem.  Skin: Negative for rash, color change, pallor and wound.  Neurological:. Negative for dizziness/light-headedness, tremors, seizures, syncope, facial asymmetry, speech difficulty, weakness, numbness, headaches and paresthesias.  Hematological: Negative for adenopathy. Does not bruise/bleed easily.  Psychiatric/Behavioral:  Negative for depression, no loss of interest in normal activity or change in sleep pattern.   Physical Exam: This is a pleasant, somewhat, 67 year old, cushingoid, white female, in no obvious distress Vitals: Temperature 98.2 degrees pulse 119 respirations 16 blood pressure 111/75 weight 158 pounds HEENT reveals a normocephalic, atraumatic skull, no scleral icterus, no oral lesions  Neck is supple without any cervical or supraclavicular adenopathy.  Lungs are clear to auscultation bilaterally. There are no wheezes, rales or rhonci Cardiac is regular rate and rhythm with a normal S1 and S2. There are no murmurs, rubs, or bruits.  Abdomen is soft with good bowel sounds, there is no palpable mass. There is no palpable hepatosplenomegaly. There is no palpable fluid wave.  Musculoskeletal no tenderness  of the spine, ribs, or hips.  Extremities there are no clubbing, cyanosis, or edema.   Skin no petechia, purpura or ecchymosis Neurologic is nonfocal.  Laboratory Data: White count 7.4 hemoglobin 9.7 hematocrit 29.4 platelets 196,000  Radiology: Chest x-ray 11/11/2012 Impression: Interstitial and airspace opacity, most confluent in the suprahilar left upper lobe.  Favor infection.  Given the clinical history, differential considerations include drug toxicity or lymphangitic tumor spread.  Current Outpatient Prescriptions on File Prior to Visit  Medication Sig Dispense Refill  . Calcium Carbonate-Vitamin D (CALTRATE 600+D PO) Take 1 tablet by mouth 2 (two) times daily.      . Cholecalciferol (VITAMIN D) 2000 UNITS CAPS Take 2,000 Units by mouth daily.       Marland Kitchen dexamethasone (DECADRON) 4 MG tablet Take 4 mg by mouth 2 (two) times daily.       Marland Kitchen ezetimibe (ZETIA) 10 MG tablet Take 10 mg by mouth every evening.      . fluconazole (DIFLUCAN) 100 MG tablet Take 100 mg by mouth daily.      . folic acid (FOLVITE) 1 MG tablet Take 1 tablet (1 mg total) by mouth daily. Take daily starting 5-7 days before Alimta chemotherapy. Continue until 21 days after Alimta completed.  100 tablet  3  . LORazepam (ATIVAN) 0.5 MG tablet Take 1 tablet (0.5 mg total) by mouth every 6 (six) hours as needed (Nausea or vomiting).  30 tablet  0  . Lysine 500 MG TABS Take 500 mg by mouth daily as needed (out breaks).       . metoprolol succinate (TOPROL-XL) 25 MG 24 hr tablet Take 25 mg by mouth at bedtime.       . Multiple Vitamin (MULTIVITAMIN WITH MINERALS) TABS Take 1 tablet by mouth daily.      Marland Kitchen omeprazole (PRILOSEC) 20 MG capsule Take 20 mg by mouth daily.      . ondansetron (ZOFRAN) 8 MG tablet Take 1 tablet (8 mg total) by mouth 2 (two) times daily. Take two times a day starting the day after chemo for 3 days. Then take two times a day as needed for nausea or vomiting.  30 tablet  1  . oxycodone (OXY-IR) 5 MG capsule Take 5 mg by mouth 4 (four) times daily.      . pramipexole (MIRAPEX) 1 MG tablet  Take 1-1.5 mg by mouth 3 (three) times daily. 1 tablets bid and 1.5 tablets at bedtime      . prochlorperazine (COMPAZINE) 10 MG tablet Take 10 mg by mouth every 6 (six) hours as needed. Nausea      . pyridostigmine (MESTINON) 60 MG tablet Take 60 mg by mouth 3 (three) times daily.      . valACYclovir (VALTREX) 500 MG tablet       . zolpidem (AMBIEN CR) 6.25 MG CR tablet Take 6.25 mg by mouth at bedtime.       No current facility-administered medications on file prior to visit.   Assessment/Plan: This is a pleasant, 68 year old, white female, with the following issues:  #1.  Elevated temperature/cough.  I did send her for a chest x-ray.  It does favor infection.  I'm going to go ahead and give her Avelox 400 mg to take one by mouth daily for 7 days.  I'm also going to give her Jerilynn Som for her cough.  I informed her should she get worse or her temperature continued without remitting to give our office a call.  #2.  Metastatic adenocarcinoma of the lung.  She does not have any genetic mutations such that we could use one of the new targeted drugs.  She is s/p her first cycle of Alimta/carboplatin.  Again, her, Avastin will not start until the second cycle secondary to her recent port placement.  The overall plan, is to do 2 cycles and then repeat a CT scan to see how she has responded.  #3.  Port-A-Cath. There's been no complications.  #4.  Supportive therapy.  She will continue on Zometa every 3-4 weeks.  #5.  Anti-emetics.  She does have Zofran and Compazine if needed.  #6.  Cushingoid appearance./Weakness.  This is likely secondary to the, steroids.  She is on 2 mg of Decadron daily.  We will continue to taper, as needed.  #7.  Followup.  We will follow back up with Ms. Dominik  in one week, but before then should there be questions or concerns.

## 2012-11-12 ENCOUNTER — Other Ambulatory Visit: Payer: Self-pay | Admitting: Hematology & Oncology

## 2012-11-13 ENCOUNTER — Encounter: Payer: Self-pay | Admitting: Radiation Oncology

## 2012-11-13 DIAGNOSIS — Z923 Personal history of irradiation: Secondary | ICD-10-CM | POA: Insufficient documentation

## 2012-11-14 ENCOUNTER — Ambulatory Visit (HOSPITAL_BASED_OUTPATIENT_CLINIC_OR_DEPARTMENT_OTHER): Payer: Medicare Other

## 2012-11-16 ENCOUNTER — Ambulatory Visit
Admission: RE | Admit: 2012-11-16 | Discharge: 2012-11-16 | Disposition: A | Discharge status: Home / Self Care | Payer: Medicare Other | Source: Ambulatory Visit | Attending: Radiation Oncology | Admitting: Radiation Oncology

## 2012-11-16 ENCOUNTER — Encounter: Payer: Self-pay | Admitting: Radiation Oncology

## 2012-11-16 VITALS — BP 108/74 | HR 121 | Temp 98.1°F | Resp 20 | Wt 162.9 lb

## 2012-11-16 DIAGNOSIS — C7802 Secondary malignant neoplasm of left lung: Secondary | ICD-10-CM

## 2012-11-16 MED ORDER — OXYCODONE HCL 5 MG PO CAPS: 5.0000 mg | ORAL_CAPSULE | ORAL | Status: DC | PRN

## 2012-11-16 NOTE — Progress Notes (Signed)
Pt denies pain today but states when she has pain it begins at the left base of skull, radiates up over her head and over her left eye. She takes Oxy-IR 5 mg w/good relief. She denies dizziness, unsteadiness, but states she "almost gets nauseated at breakfast time", c/o increasing blurred vision and difficulty reading. Pt tapered off Dexamethasone, last dose 2 days ago. She is on meds for recent fever, thrush infection. Pt states she is regaining strength, energy, receives PT 2 x weekly. Reports good appetite, c/o recent edema of ankles, SOB w/minimal exertion.

## 2012-11-16 NOTE — Progress Notes (Signed)
Radiation Oncology         [phone] ________________________________  Name: NOTNAMED CROUCHER MRN: [id number]  Date: 11/16/2012  DOB: 01-14-1946  Follow-Up Visit Note  CC: Josph Macho, MD  Josph Macho, MD  Diagnosis:   Metastatic non-small cell lung cancer  Interval Since Last Radiation:  1  months .she completed treatments to a painful sternal mass and the primary site in the left lower lung area.  Narrative:  The patient returns today for routine follow-up. She seems to be making good progress at this time. Her overall strength is improving since been tapered off steroids. She ambulates with the assistance of a walker. She denies any further pain in the upper chest region. She does occasionally have pain in the left upper neck which will radiate into the scalp area.  She has started chemotherapy and appears to be tolerating this well.                        ALLERGIES:  has No Known Allergies.  Meds: Current Outpatient Prescriptions  Medication Sig Dispense Refill  . Alum & Mag Hydroxide-Simeth (MAGIC MOUTHWASH) SOLN Take 15 mLs by mouth 3 (three) times daily as needed. Swish and spit  240 mL  1  . benzonatate (TESSALON) 200 MG capsule Take 1 capsule (200 mg total) by mouth 3 (three) times daily as needed for cough.  50 capsule  0  . Calcium Carbonate-Vitamin D (CALTRATE 600+D PO) Take 1 tablet by mouth 2 (two) times daily.      . Cholecalciferol (VITAMIN D) 2000 UNITS CAPS Take 2,000 Units by mouth daily.       Marland Kitchen ezetimibe (ZETIA) 10 MG tablet Take 10 mg by mouth every evening.      . fluconazole (DIFLUCAN) 100 MG tablet Take 1 tablet (100 mg total) by mouth daily.  30 tablet  5  . folic acid (FOLVITE) 1 MG tablet Take 1 tablet (1 mg total) by mouth daily. Take daily starting 5-7 days before Alimta chemotherapy. Continue until 21 days after Alimta completed.  100 tablet  3  . lidocaine-prilocaine (EMLA) cream Apply topically as needed.       Marland Kitchen LORazepam (ATIVAN) 0.5 MG  tablet Take 1 tablet (0.5 mg total) by mouth every 6 (six) hours as needed (Nausea or vomiting).  30 tablet  0  . Lysine 500 MG TABS Take 500 mg by mouth daily as needed (out breaks).       . Multiple Vitamin (MULTIVITAMIN WITH MINERALS) TABS Take 1 tablet by mouth daily.      Marland Kitchen omeprazole (PRILOSEC) 20 MG capsule Take 20 mg by mouth daily.      . ondansetron (ZOFRAN) 8 MG tablet Take 1 tablet (8 mg total) by mouth 2 (two) times daily. Take two times a day starting the day after chemo for 3 days. Then take two times a day as needed for nausea or vomiting.  30 tablet  1  . oxycodone (OXY-IR) 5 MG capsule Take 1 capsule (5 mg total) by mouth every 4 (four) hours as needed for pain.  50 capsule  0  . pramipexole (MIRAPEX) 1 MG tablet Take 1-1.5 mg by mouth 3 (three) times daily. 1 tablets bid and 1.5 tablets at bedtime      . prochlorperazine (COMPAZINE) 10 MG tablet Take 10 mg by mouth every 6 (six) hours as needed. Nausea      . pyridostigmine (MESTINON) 60 MG tablet Take  60 mg by mouth 3 (three) times daily.      . valACYclovir (VALTREX) 500 MG tablet Take 500 mg by mouth as needed.       . zolpidem (AMBIEN CR) 6.25 MG CR tablet       . moxifloxacin (AVELOX) 400 MG tablet Take 1 tablet (400 mg total) by mouth daily. One po daily x 7 days  7 tablet  0   No current facility-administered medications for this encounter.    Physical Findings: The patient is in no acute distress. Patient is alert and oriented.  Accompanied by her husband and son on evaluation today  weight is 162 lb 14.4 oz (73.891 kg). Her oral temperature is 98.1 F (36.7 C). Her blood pressure is 108/74 and her pulse is 121. Her respiration is 20. .  The oral cavity is free of secondary infection. The lungs are clear to auscultation. The heart has regular rhythm and rate. The soft tissue mass in the upper anterior chest is softer and smaller on exam today. The skin is intact.  She continues to have significant cushingoid findings on  clinical exam. She does have some pitting edema in the ankle and foot areas bilaterally. No palpable cords.  Lab Findings: Lab Results  Component Value Date   WBC 7.4 11/11/2012   HGB 9.7* 11/11/2012   HCT 29.4* 11/11/2012   MCV 90 11/11/2012   PLT 196 11/11/2012       Impression:  The patient is recovering from the effects of radiation.  Improvement as above  Plan:  When necessary followup in radiation oncology. The patient will continue close followup with medical oncology with her active chemotherapy administration.  _____________________________________ -----------------------------------  Billie Lade, PhD, MD

## 2012-11-17 NOTE — Telephone Encounter (Signed)
No additional note

## 2012-11-18 ENCOUNTER — Telehealth: Payer: Self-pay | Admitting: Hematology & Oncology

## 2012-11-18 ENCOUNTER — Other Ambulatory Visit: Payer: Self-pay | Admitting: *Deleted

## 2012-11-18 DIAGNOSIS — C7802 Secondary malignant neoplasm of left lung: Secondary | ICD-10-CM

## 2012-11-18 NOTE — Telephone Encounter (Signed)
Called [facility] about rehab appointments. Spoke with Darlina Rumpf who said pt had 11-23-12 appointment set up for PT.

## 2012-11-19 ENCOUNTER — Ambulatory Visit (HOSPITAL_BASED_OUTPATIENT_CLINIC_OR_DEPARTMENT_OTHER): Payer: Medicare Other

## 2012-11-19 ENCOUNTER — Ambulatory Visit (HOSPITAL_BASED_OUTPATIENT_CLINIC_OR_DEPARTMENT_OTHER): Payer: Medicare Other | Admitting: Hematology & Oncology

## 2012-11-19 ENCOUNTER — Other Ambulatory Visit (HOSPITAL_BASED_OUTPATIENT_CLINIC_OR_DEPARTMENT_OTHER): Payer: Medicare Other | Admitting: Lab

## 2012-11-19 ENCOUNTER — Other Ambulatory Visit: Payer: Self-pay | Admitting: *Deleted

## 2012-11-19 VITALS — BP 104/70 | HR 102 | Temp 98.3°F | Resp 16 | Ht 63.0 in | Wt 162.0 lb

## 2012-11-19 DIAGNOSIS — C3430 Malignant neoplasm of lower lobe, unspecified bronchus or lung: Secondary | ICD-10-CM

## 2012-11-19 DIAGNOSIS — C7952 Secondary malignant neoplasm of bone marrow: Secondary | ICD-10-CM

## 2012-11-19 DIAGNOSIS — Z5111 Encounter for antineoplastic chemotherapy: Secondary | ICD-10-CM

## 2012-11-19 DIAGNOSIS — L0390 Cellulitis, unspecified: Secondary | ICD-10-CM

## 2012-11-19 DIAGNOSIS — C7802 Secondary malignant neoplasm of left lung: Secondary | ICD-10-CM

## 2012-11-19 DIAGNOSIS — D649 Anemia, unspecified: Secondary | ICD-10-CM

## 2012-11-19 DIAGNOSIS — L539 Erythematous condition, unspecified: Secondary | ICD-10-CM

## 2012-11-19 DIAGNOSIS — C7951 Secondary malignant neoplasm of bone: Secondary | ICD-10-CM

## 2012-11-19 LAB — CBC WITH DIFFERENTIAL (CANCER CENTER ONLY)
BASO#: 0.1 10*3/uL (ref 0.0–0.2)
EOS%: 0.6 % (ref 0.0–7.0)
Eosinophils Absolute: 0.1 10*3/uL (ref 0.0–0.5)
HCT: 29.3 % — ABNORMAL LOW (ref 34.8–46.6)
HGB: 9.3 g/dL — ABNORMAL LOW (ref 11.6–15.9)
LYMPH#: 1.1 10*3/uL (ref 0.9–3.3)
MONO#: 1.2 10*3/uL — ABNORMAL HIGH (ref 0.1–0.9)
NEUT#: 5.8 10*3/uL (ref 1.5–6.5)
NEUT%: 70.5 % (ref 39.6–80.0)
RBC: 3.12 10*6/uL — ABNORMAL LOW (ref 3.70–5.32)
WBC: 8.2 10*3/uL (ref 3.9–10.0)

## 2012-11-19 LAB — BASIC METABOLIC PANEL - CANCER CENTER ONLY
BUN, Bld: 8 mg/dL (ref 7–22)
CO2: 27 mEq/L (ref 18–33)
Chloride: 100 mEq/L (ref 98–108)
Creat: 0.5 mg/dl — ABNORMAL LOW (ref 0.6–1.2)
Potassium: 2.9 mEq/L — CL (ref 3.3–4.7)

## 2012-11-19 MED ORDER — ZOLEDRONIC ACID 4 MG/100ML IV SOLN
4.0000 mg | Freq: Once | INTRAVENOUS | Status: AC
Start: 2012-11-19 — End: 2012-11-19
  Administered 2012-11-19: 4 mg via INTRAVENOUS
  Filled 2012-11-19: qty 100

## 2012-11-19 MED ORDER — AMOXICILLIN-POT CLAVULANATE 875-125 MG PO TABS: 1.0000 | ORAL_TABLET | Freq: Two times a day (BID) | ORAL | Status: DC

## 2012-11-19 MED ORDER — SODIUM CHLORIDE 0.9 % IV SOLN
Freq: Once | INTRAVENOUS | Status: AC
  Administered 2012-11-19: 11:00:00 via INTRAVENOUS

## 2012-11-19 MED ORDER — ONDANSETRON 16 MG/50ML IVPB (CHCC)
16.0000 mg | Freq: Once | INTRAVENOUS | Status: AC
  Administered 2012-11-19: 16 mg via INTRAVENOUS

## 2012-11-19 MED ORDER — FERUMOXYTOL INJECTION 510 MG/17 ML
510.0000 mg | Freq: Once | INTRAVENOUS | Status: AC
  Administered 2012-11-19: 510 mg via INTRAVENOUS
  Filled 2012-11-19: qty 17

## 2012-11-19 MED ORDER — SODIUM CHLORIDE 0.9 % IV SOLN
3.0000 g | Freq: Once | INTRAVENOUS | Status: AC
  Administered 2012-11-19: 3 g via INTRAVENOUS
  Filled 2012-11-19: qty 3

## 2012-11-19 MED ORDER — TRIAMTERENE-HCTZ 37.5-25 MG PO TABS: ORAL_TABLET | ORAL | Status: DC

## 2012-11-19 MED ORDER — POTASSIUM CHLORIDE CRYS ER 20 MEQ PO TBCR
EXTENDED_RELEASE_TABLET | ORAL | Status: DC
Start: 2012-11-19 — End: 2012-12-08

## 2012-11-19 MED ORDER — SODIUM CHLORIDE 0.9 % IJ SOLN
10.0000 mL | INTRAMUSCULAR | Status: DC | PRN
  Filled 2012-11-19: qty 10

## 2012-11-19 MED ORDER — SODIUM CHLORIDE 0.9 % IV SOLN
450.0000 mg/m2 | Freq: Once | INTRAVENOUS | Status: AC
  Administered 2012-11-19: 800 mg via INTRAVENOUS
  Filled 2012-11-19: qty 32

## 2012-11-19 MED ORDER — SODIUM CHLORIDE 0.9 % IV SOLN
500.0000 mg | Freq: Once | INTRAVENOUS | Status: AC
  Administered 2012-11-19: 500 mg via INTRAVENOUS
  Filled 2012-11-19: qty 50

## 2012-11-19 MED ORDER — HEPARIN SOD (PORK) LOCK FLUSH 100 UNIT/ML IV SOLN
500.0000 [IU] | Freq: Once | INTRAVENOUS | Status: DC | PRN
  Filled 2012-11-19: qty 5

## 2012-11-19 MED ORDER — DEXAMETHASONE SODIUM PHOSPHATE 20 MG/5ML IJ SOLN
20.0000 mg | Freq: Once | INTRAMUSCULAR | Status: AC
  Administered 2012-11-19: 20 mg via INTRAVENOUS
  Filled 2012-11-19: qty 5

## 2012-11-19 NOTE — Patient Instructions (Addendum)
Pemetrexed injection What is this medicine? PEMETREXED (PEM e TREX ed) is a chemotherapy drug. This medicine affects cells that are rapidly growing, such as cancer cells and cells in your mouth and stomach. It is usually used to treat lung cancers like non-small cell lung cancer and mesothelioma. It may also be used to treat other cancers. This medicine may be used for other purposes; ask your health care provider or pharmacist if you have questions. What should I tell my health care provider before I take this medicine? They need to know if you have any of these conditions: -if you frequently drink alcohol containing beverages -infection (especially a virus infection such as chickenpox, cold sores, or herpes) -kidney disease -liver disease -low blood counts, like low platelets, red bloods, or white blood cells -an unusual or allergic reaction to pemetrexed, mannitol, other medicines, foods, dyes, or preservatives -pregnant or trying to get pregnant -breast-feeding How should I use this medicine? This drug is given as an infusion into a vein. It is administered in a hospital or clinic by a specially trained health care professional. Talk to your pediatrician regarding the use of this medicine in children. Special care may be needed. Overdosage: If you think you have taken too much of this medicine contact a poison control center or emergency room at once. NOTE: This medicine is only for you. Do not share this medicine with others. What if I miss a dose? It is important not to miss your dose. Call your doctor or health care professional if you are unable to keep an appointment. What may interact with this medicine? -aspirin and aspirin-like medicines -medicines to increase blood counts like filgrastim, pegfilgrastim, sargramostim -methotrexate -NSAIDS, medicines for pain and inflammation, like ibuprofen or naproxen -probenecid -pyrimethamine -vaccines Talk to your doctor or health care  professional before taking any of these medicines: -acetaminophen -aspirin -ibuprofen -ketoprofen -naproxen This list may not describe all possible interactions. Give your health care provider a list of all the medicines, herbs, non-prescription drugs, or dietary supplements you use. Also tell them if you smoke, drink alcohol, or use illegal drugs. Some items may interact with your medicine. What should I watch for while using this medicine? Visit your doctor for checks on your progress. This drug may make you feel generally unwell. This is not uncommon, as chemotherapy can affect healthy cells as well as cancer cells. Report any side effects. Continue your course of treatment even though you feel ill unless your doctor tells you to stop. In some cases, you may be given additional medicines to help with side effects. Follow all directions for their use. Call your doctor or health care professional for advice if you get a fever, chills or sore throat, or other symptoms of a cold or flu. Do not treat yourself. This drug decreases your body's ability to fight infections. Try to avoid being around people who are sick. This medicine may increase your risk to bruise or bleed. Call your doctor or health care professional if you notice any unusual bleeding. Be careful brushing and flossing your teeth or using a toothpick because you may get an infection or bleed more easily. If you have any dental work done, tell your dentist you are receiving this medicine. Avoid taking products that contain aspirin, acetaminophen, ibuprofen, naproxen, or ketoprofen unless instructed by your doctor. These medicines may hide a fever. Call your doctor or health care professional if you get diarrhea or mouth sores. Do not treat yourself. To protect your  kidneys, drink water or other fluids as directed while you are taking this medicine. Men and women must use effective birth control while taking this medicine. You may also  need to continue using effective birth control for a time after stopping this medicine. Do not become pregnant while taking this medicine. Tell your doctor right away if you think that you or your partner might be pregnant. There is a potential for serious side effects to an unborn child. Talk to your health care professional or pharmacist for more information. Do not breast-feed an infant while taking this medicine. This medicine may lower sperm counts. What side effects may I notice from receiving this medicine? Side effects that you should report to your doctor or health care professional as soon as possible: -allergic reactions like skin rash, itching or hives, swelling of the face, lips, or tongue -low blood counts - this medicine may decrease the number of white blood cells, red blood cells and platelets. You may be at increased risk for infections and bleeding. -signs of infection - fever or chills, cough, sore throat, pain or difficulty passing urine -signs of decreased platelets or bleeding - bruising, pinpoint red spots on the skin, black, tarry stools, blood in the urine -signs of decreased red blood cells - unusually weak or tired, fainting spells, lightheadedness -breathing problems, like a dry cough -changes in emotions or moods -chest pain -confusion -diarrhea -high blood pressure -mouth or throat sores or ulcers -pain, swelling, warmth in the leg -pain on swallowing -swelling of the ankles, feet, hands -trouble passing urine or change in the amount of urine -vomiting -yellowing of the eyes or skin Side effects that usually do not require medical attention (report to your doctor or health care professional if they continue or are bothersome): -hair loss -loss of appetite -nausea -stomach upset This list may not describe all possible side effects. Call your doctor for medical advice about side effects. You may report side effects to FDA at 1-800-FDA-1088. Where should I keep  my medicine? This drug is given in a hospital or clinic and will not be stored at home. NOTE: This sheet is a summary. It may not cover all possible information. If you have questions about this medicine, talk to your doctor, pharmacist, or health care provider.  2013, [employer]. (01/05/2008 1:24:03 PM)   Carboplatin injection  What is this medicine? CARBOPLATIN (KAR boe pla tin) is a chemotherapy drug. It targets fast dividing cells, like cancer cells, and causes these cells to die. This medicine is used to treat ovarian cancer and many other cancers. This medicine may be used for other purposes; ask your health care provider or pharmacist if you have questions. What should I tell my health care provider before I take this medicine? They need to know if you have any of these conditions: -blood disorders -hearing problems -kidney disease -recent or ongoing radiation therapy -an unusual or allergic reaction to carboplatin, cisplatin, other chemotherapy, other medicines, foods, dyes, or preservatives -pregnant or trying to get pregnant -breast-feeding How should I use this medicine? This drug is usually given as an infusion into a vein. It is administered in a hospital or clinic by a specially trained health care professional. Talk to your pediatrician regarding the use of this medicine in children. Special care may be needed. Overdosage: If you think you have taken too much of this medicine contact a poison control center or emergency room at once. NOTE: This medicine is only for you. Do  not share this medicine with others. What if I miss a dose? It is important not to miss a dose. Call your doctor or health care professional if you are unable to keep an appointment. What may interact with this medicine? -medicines for seizures -medicines to increase blood counts like filgrastim, pegfilgrastim, sargramostim -some antibiotics like amikacin, gentamicin, neomycin, streptomycin,  tobramycin -vaccines Talk to your doctor or health care professional before taking any of these medicines: -acetaminophen -aspirin -ibuprofen -ketoprofen -naproxen This list may not describe all possible interactions. Give your health care provider a list of all the medicines, herbs, non-prescription drugs, or dietary supplements you use. Also tell them if you smoke, drink alcohol, or use illegal drugs. Some items may interact with your medicine. What should I watch for while using this medicine? Your condition will be monitored carefully while you are receiving this medicine. You will need important blood work done while you are taking this medicine. This drug may make you feel generally unwell. This is not uncommon, as chemotherapy can affect healthy cells as well as cancer cells. Report any side effects. Continue your course of treatment even though you feel ill unless your doctor tells you to stop. In some cases, you may be given additional medicines to help with side effects. Follow all directions for their use. Call your doctor or health care professional for advice if you get a fever, chills or sore throat, or other symptoms of a cold or flu. Do not treat yourself. This drug decreases your body's ability to fight infections. Try to avoid being around people who are sick. This medicine may increase your risk to bruise or bleed. Call your doctor or health care professional if you notice any unusual bleeding. Be careful brushing and flossing your teeth or using a toothpick because you may get an infection or bleed more easily. If you have any dental work done, tell your dentist you are receiving this medicine. Avoid taking products that contain aspirin, acetaminophen, ibuprofen, naproxen, or ketoprofen unless instructed by your doctor. These medicines may hide a fever. Do not become pregnant while taking this medicine. Women should inform their doctor if they wish to become pregnant or think  they might be pregnant. There is a potential for serious side effects to an unborn child. Talk to your health care professional or pharmacist for more information. Do not breast-feed an infant while taking this medicine. What side effects may I notice from receiving this medicine? Side effects that you should report to your doctor or health care professional as soon as possible: -allergic reactions like skin rash, itching or hives, swelling of the face, lips, or tongue -signs of infection - fever or chills, cough, sore throat, pain or difficulty passing urine -signs of decreased platelets or bleeding - bruising, pinpoint red spots on the skin, black, tarry stools, nosebleeds -signs of decreased red blood cells - unusually weak or tired, fainting spells, lightheadedness -breathing problems -changes in hearing -changes in vision -chest pain -high blood pressure -low blood counts - This drug may decrease the number of white blood cells, red blood cells and platelets. You may be at increased risk for infections and bleeding. -nausea and vomiting -pain, swelling, redness or irritation at the injection site -pain, tingling, numbness in the hands or feet -problems with balance, talking, walking -trouble passing urine or change in the amount of urine Side effects that usually do not require medical attention (report to your doctor or health care professional if they continue or  are bothersome): -hair loss -loss of appetite -metallic taste in the mouth or changes in taste This list may not describe all possible side effects. Call your doctor for medical advice about side effects. You may report side effects to FDA at 1-800-FDA-1088. Where should I keep my medicine? This drug is given in a hospital or clinic and will not be stored at home. NOTE: This sheet is a summary. It may not cover all possible information. If you have questions about this medicine, talk to your doctor, pharmacist, or health care  provider.  2012, [employer]. (09/08/2007 2:38:05 PM)  Ferumoxytol injection  What is this medicine? FERUMOXYTOL is an iron complex. Iron is used to make healthy red blood cells, which carry oxygen and nutrients throughout the body. This medicine is used to treat iron deficiency anemia in people with chronic kidney disease. This medicine may be used for other purposes; ask your health care provider or pharmacist if you have questions. What should I tell my health care provider before I take this medicine? They need to know if you have any of these conditions: -anemia not caused by low iron levels -high levels of iron in the blood -magnetic resonance imaging (MRI) test scheduled -an unusual or allergic reaction to iron, other medicines, foods, dyes, or preservatives -pregnant or trying to get pregnant -breast-feeding How should I use this medicine? This medicine is for infusion into a vein. It is given by a health care professional in a hospital or clinic setting. Talk to your pediatrician regarding the use of this medicine in children. Special care may be needed. Overdosage: If you think you've taken too much of this medicine contact a poison control center or emergency room at once. Overdosage: If you think you have taken too much of this medicine contact a poison control center or emergency room at once. NOTE: This medicine is only for you. Do not share this medicine with others. What if I miss a dose? It is important not to miss your dose. Call your doctor or health care professional if you are unable to keep an appointment. What may interact with this medicine? This medicine may interact with the following medications: -other iron products This list may not describe all possible interactions. Give your health care provider a list of all the medicines, herbs, non-prescription drugs, or dietary supplements you use. Also tell them if you smoke, drink alcohol, or use illegal  drugs. Some items may interact with your medicine. What should I watch for while using this medicine? Visit your doctor or healthcare professional regularly. Tell your doctor or healthcare professional if your symptoms do not start to get better or if they get worse. You may need blood work done while you are taking this medicine. You may need to follow a special diet. Talk to your doctor. Foods that contain iron include: whole grains/cereals, dried fruits, beans, or peas, leafy green vegetables, and organ meats (liver, kidney). What side effects may I notice from receiving this medicine? Side effects that you should report to your doctor or health care professional as soon as possible: -allergic reactions like skin rash, itching or hives, swelling of the face, lips, or tongue -breathing problems -changes in blood pressure -feeling faint or lightheaded, falls -fever or chills -flushing, sweating, or hot feelings -swelling of the ankles or feet Side effects that usually do not require medical attention (Report these to your doctor or health care professional if they continue or are bothersome.): -diarrhea -headache -nausea, vomiting -  stomach pain This list may not describe all possible side effects. Call your doctor for medical advice about side effects. You may report side effects to FDA at 1-800-FDA-1088. Where should I keep my medicine? This drug is given in a hospital or clinic and will not be stored at home. NOTE: This sheet is a summary. It may not cover all possible information. If you have questions about this medicine, talk to your doctor, pharmacist, or health care provider.  2012, [employer]. (02/24/2008 9:48:25 PM)  Zoledronic Acid injection (Hypercalcemia, Oncology) What is this medicine? ZOLEDRONIC ACID (ZOE le dron ik AS id) lowers the amount of calcium loss from bone. It is used to treat too much calcium in your blood from cancer. It is also used to prevent  complications of cancer that has spread to the bone. This medicine may be used for other purposes; ask your health care provider or pharmacist if you have questions. What should I tell my health care provider before I take this medicine? They need to know if you have any of these conditions: -aspirin-sensitive asthma -dental disease -kidney disease -an unusual or allergic reaction to zoledronic acid, other medicines, foods, dyes, or preservatives -pregnant or trying to get pregnant -breast-feeding How should I use this medicine? This medicine is for infusion into a vein. It is given by a health care professional in a hospital or clinic setting. Talk to your pediatrician regarding the use of this medicine in children. Special care may be needed. Overdosage: If you think you have taken too much of this medicine contact a poison control center or emergency room at once. NOTE: This medicine is only for you. Do not share this medicine with others. What if I miss a dose? It is important not to miss your dose. Call your doctor or health care professional if you are unable to keep an appointment. What may interact with this medicine? -certain antibiotics given by injection -NSAIDs, medicines for pain and inflammation, like ibuprofen or naproxen -some diuretics like bumetanide, furosemide -teriparatide -thalidomide This list may not describe all possible interactions. Give your health care provider a list of all the medicines, herbs, non-prescription drugs, or dietary supplements you use. Also tell them if you smoke, drink alcohol, or use illegal drugs. Some items may interact with your medicine. What should I watch for while using this medicine? Visit your doctor or health care professional for regular checkups. It may be some time before you see the benefit from this medicine. Do not stop taking your medicine unless your doctor tells you to. Your doctor may order blood tests or other tests to see  how you are doing. Women should inform their doctor if they wish to become pregnant or think they might be pregnant. There is a potential for serious side effects to an unborn child. Talk to your health care professional or pharmacist for more information. You should make sure that you get enough calcium and vitamin D while you are taking this medicine. Discuss the foods you eat and the vitamins you take with your health care professional. Some people who take this medicine have severe bone, joint, and/or muscle pain. This medicine may also increase your risk for a broken thigh bone. Tell your doctor right away if you have pain in your upper leg or groin. Tell your doctor if you have any pain that does not go away or that gets worse. What side effects may I notice from receiving this medicine? Side effects that you  should report to your doctor or health care professional as soon as possible: -allergic reactions like skin rash, itching or hives, swelling of the face, lips, or tongue -anxiety, confusion, or depression -breathing problems -changes in vision -feeling faint or lightheaded, falls -jaw burning, cramping, pain -muscle cramps, stiffness, or weakness -trouble passing urine or change in the amount of urine Side effects that usually do not require medical attention (report to your doctor or health care professional if they continue or are bothersome): -bone, joint, or muscle pain -fever -hair loss -irritation at site where injected -loss of appetite -nausea, vomiting -stomach upset -tired This list may not describe all possible side effects. Call your doctor for medical advice about side effects. You may report side effects to FDA at 1-800-FDA-1088. Where should I keep my medicine? This drug is given in a hospital or clinic and will not be stored at home. NOTE: This sheet is a summary. It may not cover all possible information. If you have questions about this medicine, talk to your  doctor, pharmacist, or health care provider.  2013, [employer]. (11/30/2010 9:06:58 AM)  Ampicillin; Sulbactam injection What is this medicine? AMPICILLIN; SULBACTAM (am pi SILL in; sul BAK tam) is a penicillin antibiotic. It is used to treat certain kinds of bacterial infections. It will not work for colds, flu, or other viral infections. This medicine may be used for other purposes; ask your health care provider or pharmacist if you have questions. What should I tell my health care provider before I take this medicine? They need to know if you have any of these conditions: -heart disease -kidney disease -mononucleosis -an unusual or allergic reaction to ampicillin, other penicillins or antibiotics, foods, dyes, or preservatives -pregnant or trying to get pregnant -breast-feeding How should I use this medicine? This medicine is infused into a vein or injected deep into a muscle. It is usually given by a health care professional in a hospital or clinic setting. If you get this medicine at home, you will be taught how to prepare and give this medicine. Use exactly as directed. Take your medicine at regular intervals. Do not take your medicine more often than directed. It is important that you put your used needles and syringes in a special sharps container. Do not put them in a trash can. If you do not have a sharps container, call your pharmacist or healthcare provider to get one. Talk to your pediatrician regarding the use of this medicine in children. Special care may be needed. Overdosage: If you think you have taken too much of this medicine contact a poison control center or emergency room at once. NOTE: This medicine is only for you. Do not share this medicine with others. What if I miss a dose? If you miss a dose, take it as soon as you can. If it is almost time for your next dose, take only that dose. Do not take double or extra doses. What may interact with this  medicine? -allopurinol -female hormones, including contraceptive or birth control pills -probenecid -some other antibiotics given by injection This list may not describe all possible interactions. Give your health care provider a list of all the medicines, herbs, non-prescription drugs, or dietary supplements you use. Also tell them if you smoke, drink alcohol, or use illegal drugs. Some items may interact with your medicine. What should I watch for while using this medicine? Tell your doctor or health care professional if your symptoms do not improve or if you  get new symptoms. Do not treat diarrhea with over the counter products. Contact your doctor if you have diarrhea that lasts more than 2 days or if the diarrhea is severe and watery. This medicine can interfere with some urine glucose tests. If you use such tests, talk with your health care professional. Birth control pills may not work properly while you are taking this medicine. Talk to your doctor about using an extra method of birth control. What side effects may I notice from receiving this medicine? Side effects that you should report to your doctor or health care professional as soon as possible: -allergic reactions like skin rash or hives, swelling of the face, lips, or tongue -chest pain -difficulty breathing -fever, chills -pain or difficulty passing urine -redness, blistering, peeling or loosening of the skin, including inside the mouth -seizures -unusual bleeding, bruising -unusually weak or tired Side effects that usually do not require medical attention (report to your doctor or health care professional if they continue or are bothersome): -diarrhea -headache -heartburn -nausea, vomiting -pain, irritation at the site of injection -sore mouth, tongue -stomach gas This list may not describe all possible side effects. Call your doctor for medical advice about side effects. You may report side effects to FDA at  1-800-FDA-1088. Where should I keep my medicine? Keep out of the reach of children. You will be instructed on how to store this medicine. Throw away any unused medicine after the expiration date on the label. NOTE: This sheet is a summary. It may not cover all possible information. If you have questions about this medicine, talk to your doctor, pharmacist, or health care provider.  2013, [employer]. (08/25/2007 2:37:17 PM)

## 2012-11-19 NOTE — Progress Notes (Signed)
Pharmacy note: consult for Unasyn placed, called and spoke with [facility], consult placed in error, will remove consult.   Abran Duke, PharmD Clinical Pharmacist Phone: [phone] Pager: [phone] 11/19/2012 1:31 PM

## 2012-11-19 NOTE — Addendum Note (Signed)
Addended by: Arlan Organ R on: 11/19/2012 12:16 PM   Modules accepted: Orders

## 2012-11-19 NOTE — Telephone Encounter (Signed)
error 

## 2012-11-19 NOTE — Progress Notes (Signed)
This office note has been dictated.

## 2012-11-20 NOTE — Progress Notes (Signed)
CC:   [name] Pacheco, M.D.  DIAGNOSIS:  Metastatic lung cancer-adenocarcinoma (wild type 4 EGFR/ALK/ROS).  CURRENT THERAPY:  The patient is status post 1 cycle of chemotherapy with: 1. Alimta/carboplatin/Avastin. 2. Zometa 4 mg IV q.3 weeks.  INTERIM HISTORY:  [name] Pacheco comes in for followup.  She is doing a little bit worse today.  She was doing fairly well.  The patient has an area on her back that has been bothering her.  This is the left side of her back where she had a biopsy for the lung mass.  I looked at it.  It does look somewhat erythematous.  There may be an area of cellulitis associated with this.  We will go ahead and give her dose of IV Unasyn in the office.  We will then put her on some oral antibiotics with some Augmentin.  She has been doing well with pain otherwise.  Her sternum does not really bother her that much.  Her appetite is improving a little bit.  She has had no problems with bowels or bladder.  She continues to have some leg swelling.  This is bilaterally.  This seems to be worse at the end of the day.  We may need to put her on a little bit of a diuretic for this.  PHYSICAL EXAMINATION:  General:  This is a slightly cushingoid-appearing white female in no obvious distress.  Vital signs:  Temperature of 98.3, pulse 102, respiratory rate 16, blood pressure 104/70.  Weight is 162. Head and neck:  Normocephalic, atraumatic skull.  There are no ocular or oral lesions.  There are no palpable cervical or supraclavicular lymph nodes.  Lungs:  Clear bilaterally.  Cardiac:  Regular rate and rhythm with a normal S1 and S2.  There are no murmurs, rubs or bruits. Abdomen:  Soft with good bowel sounds.  There is no palpable abdominal mass.  There is no palpable hepatosplenomegaly.  Back:  Does show this area of erythema at about the T8 level.  This is on the left side of her back.  The area probably measures about 5 x 5 cm.  It is somewhat tender to palpation.   There is no fluctuance with this.  Extremities:  Does show some 1+ edema in her lower legs.  No palpable venous cords noted in her legs.  She has good pulses in her distal extremities.  Neurological: Shows no focal neurological deficits.  LABORATORY STUDIES:  White cell count is 8.2, hemoglobin 9.3, hematocrit 29.3, platelet count 286.  IMPRESSION:  [name] Pacheco is a 67 year old white female with metastatic adenocarcinoma of the lung.  She will go ahead with her 2nd cycle of chemotherapy.  I will go ahead and repeat her scans after the second cycle of chemotherapy.  Hopefully, we will find that she will have a response.  I will see about a diuretic for her.  I will give her some iron today.  Maybe this might help with her anemia.    ______________________________ Josph Macho, M.D. PRE/MEDQ  D:  11/19/2012  T:  11/20/2012  Job:  [id number]

## 2012-11-27 ENCOUNTER — Other Ambulatory Visit: Payer: Self-pay | Admitting: *Deleted

## 2012-11-27 ENCOUNTER — Other Ambulatory Visit (HOSPITAL_BASED_OUTPATIENT_CLINIC_OR_DEPARTMENT_OTHER): Payer: Medicare Other | Admitting: Lab

## 2012-11-27 ENCOUNTER — Ambulatory Visit (HOSPITAL_BASED_OUTPATIENT_CLINIC_OR_DEPARTMENT_OTHER): Payer: Medicare Other

## 2012-11-27 ENCOUNTER — Telehealth: Payer: Self-pay | Admitting: *Deleted

## 2012-11-27 DIAGNOSIS — R197 Diarrhea, unspecified: Secondary | ICD-10-CM

## 2012-11-27 DIAGNOSIS — C7800 Secondary malignant neoplasm of unspecified lung: Secondary | ICD-10-CM

## 2012-11-27 DIAGNOSIS — E860 Dehydration: Secondary | ICD-10-CM

## 2012-11-27 DIAGNOSIS — C3430 Malignant neoplasm of lower lobe, unspecified bronchus or lung: Secondary | ICD-10-CM

## 2012-11-27 LAB — CBC WITH DIFFERENTIAL (CANCER CENTER ONLY)
Eosinophils Absolute: 0 10*3/uL (ref 0.0–0.5)
HCT: 28.9 % — ABNORMAL LOW (ref 34.8–46.6)
LYMPH%: 12.6 % — ABNORMAL LOW (ref 14.0–48.0)
MCH: 30.8 pg (ref 26.0–34.0)
MCV: 94 fL (ref 81–101)
MONO#: 1.3 10*3/uL — ABNORMAL HIGH (ref 0.1–0.9)
MONO%: 14.3 % — ABNORMAL HIGH (ref 0.0–13.0)
NEUT%: 72.3 % (ref 39.6–80.0)
Platelets: 196 10*3/uL (ref 145–400)
RBC: 3.08 10*6/uL — ABNORMAL LOW (ref 3.70–5.32)
RDW: 22.2 % — ABNORMAL HIGH (ref 11.1–15.7)
WBC: 9.2 10*3/uL (ref 3.9–10.0)

## 2012-11-27 LAB — BASIC METABOLIC PANEL - CANCER CENTER ONLY
CO2: 22 mEq/L (ref 18–33)
Calcium: 9.3 mg/dL (ref 8.0–10.3)
Creat: 0.4 mg/dl — ABNORMAL LOW (ref 0.6–1.2)
Glucose, Bld: 149 mg/dL — ABNORMAL HIGH (ref 73–118)
Sodium: 131 mEq/L (ref 128–145)

## 2012-11-27 LAB — TECHNOLOGIST REVIEW CHCC SATELLITE: Tech Review: 4

## 2012-11-27 MED ORDER — SODIUM CHLORIDE 0.9 % IV SOLN
Freq: Once | INTRAVENOUS | Status: AC
  Administered 2012-11-27: 14:00:00 via INTRAVENOUS

## 2012-11-27 MED ORDER — HEPARIN SOD (PORK) LOCK FLUSH 100 UNIT/ML IV SOLN
500.0000 [IU] | Freq: Once | INTRAVENOUS | Status: AC
  Administered 2012-11-27: 500 [IU] via INTRAVENOUS
  Filled 2012-11-27: qty 5

## 2012-11-27 MED ORDER — SODIUM CHLORIDE 0.9 % IJ SOLN
10.0000 mL | INTRAMUSCULAR | Status: DC | PRN
  Administered 2012-11-27: 10 mL via INTRAVENOUS
  Filled 2012-11-27: qty 10

## 2012-11-27 NOTE — Progress Notes (Signed)
error 

## 2012-11-27 NOTE — Patient Instructions (Signed)
Dehydration, Adult  Dehydration means your body does not have as much fluid as it needs. Your kidneys, brain, and heart will not work properly without the right amount of fluids and salt.   HOME CARE   Ask your doctor how to replace body fluid losses (rehydrate).   Drink enough fluids to keep your pee (urine) clear or pale yellow.   Drink small amounts of fluids often if you feel sick to your stomach (nauseous) or throw up (vomit).   Eat like you normally do.   Avoid:   Foods or drinks high in sugar.   Bubbly (carbonated) drinks.   Juice.   Very hot or cold fluids.   Drinks with caffeine.   Fatty, greasy foods.   Alcohol.   Tobacco.   Eating too much.   Gelatin desserts.   Wash your hands to avoid spreading germs (bacteria, viruses).   Only take medicine as told by your doctor.   Keep all doctor visits as told.  GET HELP RIGHT AWAY IF:    You cannot drink something without throwing up.   You get worse even with treatment.   Your vomit has blood in it or looks greenish.   Your poop (stool) has blood in it or looks black and tarry.   You have not peed in 6 to 8 hours.   You pee a small amount of very dark pee.   You have a fever.   You pass out (faint).   You have belly (abdominal) pain that gets worse or stays in one spot (localizes).   You have a rash, stiff neck, or bad headache.   You get easily annoyed, sleepy, or are hard to wake up.   You feel weak, dizzy, or very thirsty.  MAKE SURE YOU:    Understand these instructions.   Will watch your condition.   Will get help right away if you are not doing well or get worse.  Document Released: 03/30/2009 Document Revised: 08/26/2011 Document Reviewed: 01/21/2011  [employer] Patient Information 2014 [employer].

## 2012-11-27 NOTE — Telephone Encounter (Signed)
Pt has been having diarrhea x 4 days. It started after she began her abx. To come in for IVF and lab work today.

## 2012-12-04 ENCOUNTER — Other Ambulatory Visit: Payer: Self-pay | Admitting: Radiation Oncology

## 2012-12-04 MED ORDER — PROCHLORPERAZINE MALEATE 10 MG PO TABS: 10.0000 mg | ORAL_TABLET | Freq: Four times a day (QID) | ORAL | Status: DC | PRN

## 2012-12-08 ENCOUNTER — Inpatient Hospital Stay (HOSPITAL_COMMUNITY)
Admission: RE | Admit: 2012-12-08 | Discharge: 2012-12-14 | DRG: 175 | Disposition: A | Discharge status: Hospice | Payer: Medicare Other | Source: Ambulatory Visit | Attending: Internal Medicine | Admitting: Internal Medicine

## 2012-12-08 ENCOUNTER — Encounter (HOSPITAL_COMMUNITY): Payer: Self-pay

## 2012-12-08 ENCOUNTER — Encounter (HOSPITAL_COMMUNITY)
Admission: RE | Admit: 2012-12-08 | Discharge: 2012-12-08 | Disposition: A | Discharge status: Home / Self Care | Payer: Medicare Other | Source: Ambulatory Visit | Attending: Hematology & Oncology | Admitting: Hematology & Oncology

## 2012-12-08 ENCOUNTER — Other Ambulatory Visit: Payer: Self-pay

## 2012-12-08 VITALS — BP 107/73 | HR 101 | Temp 98.1°F | Resp 16 | Ht 64.0 in | Wt 157.2 lb

## 2012-12-08 DIAGNOSIS — I2699 Other pulmonary embolism without acute cor pulmonale: Principal | ICD-10-CM

## 2012-12-08 DIAGNOSIS — I82403 Acute embolism and thrombosis of unspecified deep veins of lower extremity, bilateral: Secondary | ICD-10-CM

## 2012-12-08 DIAGNOSIS — G479 Sleep disorder, unspecified: Secondary | ICD-10-CM

## 2012-12-08 DIAGNOSIS — C7802 Secondary malignant neoplasm of left lung: Secondary | ICD-10-CM

## 2012-12-08 DIAGNOSIS — C801 Malignant (primary) neoplasm, unspecified: Secondary | ICD-10-CM

## 2012-12-08 DIAGNOSIS — I10 Essential (primary) hypertension: Secondary | ICD-10-CM | POA: Diagnosis present

## 2012-12-08 DIAGNOSIS — C771 Secondary and unspecified malignant neoplasm of intrathoracic lymph nodes: Secondary | ICD-10-CM | POA: Diagnosis present

## 2012-12-08 DIAGNOSIS — E785 Hyperlipidemia, unspecified: Secondary | ICD-10-CM

## 2012-12-08 DIAGNOSIS — G893 Neoplasm related pain (acute) (chronic): Secondary | ICD-10-CM | POA: Diagnosis present

## 2012-12-08 DIAGNOSIS — M25512 Pain in left shoulder: Secondary | ICD-10-CM

## 2012-12-08 DIAGNOSIS — D649 Anemia, unspecified: Secondary | ICD-10-CM

## 2012-12-08 DIAGNOSIS — R079 Chest pain, unspecified: Secondary | ICD-10-CM

## 2012-12-08 DIAGNOSIS — C7800 Secondary malignant neoplasm of unspecified lung: Secondary | ICD-10-CM

## 2012-12-08 DIAGNOSIS — Z515 Encounter for palliative care: Secondary | ICD-10-CM

## 2012-12-08 DIAGNOSIS — C3490 Malignant neoplasm of unspecified part of unspecified bronchus or lung: Secondary | ICD-10-CM | POA: Diagnosis present

## 2012-12-08 DIAGNOSIS — I824Y9 Acute embolism and thrombosis of unspecified deep veins of unspecified proximal lower extremity: Secondary | ICD-10-CM | POA: Diagnosis present

## 2012-12-08 DIAGNOSIS — D630 Anemia in neoplastic disease: Secondary | ICD-10-CM | POA: Diagnosis present

## 2012-12-08 DIAGNOSIS — G20 Parkinson's disease: Secondary | ICD-10-CM

## 2012-12-08 DIAGNOSIS — C782 Secondary malignant neoplasm of pleura: Secondary | ICD-10-CM | POA: Diagnosis present

## 2012-12-08 DIAGNOSIS — C787 Secondary malignant neoplasm of liver and intrahepatic bile duct: Secondary | ICD-10-CM | POA: Diagnosis present

## 2012-12-08 DIAGNOSIS — D6481 Anemia due to antineoplastic chemotherapy: Secondary | ICD-10-CM | POA: Diagnosis present

## 2012-12-08 DIAGNOSIS — C50919 Malignant neoplasm of unspecified site of unspecified female breast: Secondary | ICD-10-CM | POA: Diagnosis present

## 2012-12-08 DIAGNOSIS — Z923 Personal history of irradiation: Secondary | ICD-10-CM

## 2012-12-08 DIAGNOSIS — J209 Acute bronchitis, unspecified: Secondary | ICD-10-CM

## 2012-12-08 DIAGNOSIS — E43 Unspecified severe protein-calorie malnutrition: Secondary | ICD-10-CM | POA: Diagnosis present

## 2012-12-08 DIAGNOSIS — G589 Mononeuropathy, unspecified: Secondary | ICD-10-CM | POA: Diagnosis present

## 2012-12-08 DIAGNOSIS — Z87891 Personal history of nicotine dependence: Secondary | ICD-10-CM

## 2012-12-08 DIAGNOSIS — Z66 Do not resuscitate: Secondary | ICD-10-CM | POA: Diagnosis present

## 2012-12-08 DIAGNOSIS — E236 Other disorders of pituitary gland: Secondary | ICD-10-CM | POA: Diagnosis present

## 2012-12-08 DIAGNOSIS — T451X5A Adverse effect of antineoplastic and immunosuppressive drugs, initial encounter: Secondary | ICD-10-CM | POA: Diagnosis present

## 2012-12-08 DIAGNOSIS — R531 Weakness: Secondary | ICD-10-CM

## 2012-12-08 DIAGNOSIS — J0100 Acute maxillary sinusitis, unspecified: Secondary | ICD-10-CM

## 2012-12-08 DIAGNOSIS — C7951 Secondary malignant neoplasm of bone: Secondary | ICD-10-CM | POA: Diagnosis present

## 2012-12-08 DIAGNOSIS — G20A1 Parkinson's disease without dyskinesia, without mention of fluctuations: Secondary | ICD-10-CM | POA: Diagnosis present

## 2012-12-08 LAB — CBC WITH DIFFERENTIAL/PLATELET
Basophils Absolute: 0 10*3/uL (ref 0.0–0.1)
Eosinophils Absolute: 0 10*3/uL (ref 0.0–0.7)
HCT: 24.7 % — ABNORMAL LOW (ref 36.0–46.0)
Lymphs Abs: 0.6 10*3/uL — ABNORMAL LOW (ref 0.7–4.0)
MCHC: 31.2 g/dL (ref 30.0–36.0)
MCV: 91.8 fL (ref 78.0–100.0)
Monocytes Relative: 18 % — ABNORMAL HIGH (ref 3–12)
Neutro Abs: 6.9 10*3/uL (ref 1.7–7.7)
RDW: 21.2 % — ABNORMAL HIGH (ref 11.5–15.5)

## 2012-12-08 LAB — TROPONIN I: Troponin I: <0.3 ng/mL (ref <0.30)

## 2012-12-08 LAB — BASIC METABOLIC PANEL
BUN: 10 mg/dL (ref 6–23)
Chloride: 92 mEq/L — ABNORMAL LOW (ref 96–112)
Creatinine, Ser: 0.54 mg/dL (ref 0.50–1.10)
GFR calc Af Amer: >90 mL/min (ref >90)

## 2012-12-08 MED ORDER — WARFARIN VIDEO: Freq: Once | Status: DC

## 2012-12-08 MED ORDER — HEPARIN BOLUS VIA INFUSION
2000.0000 [IU] | Freq: Once | INTRAVENOUS | Status: AC
  Administered 2012-12-08: 2000 [IU] via INTRAVENOUS

## 2012-12-08 MED ORDER — PATIENT'S GUIDE TO USING COUMADIN BOOK
Freq: Once | Status: DC
  Filled 2012-12-08: qty 1

## 2012-12-08 MED ORDER — FLUDEOXYGLUCOSE F - 18 (FDG) INJECTION
19.5000 | Freq: Once | INTRAVENOUS | Status: AC | PRN
  Administered 2012-12-08: 19.5 via INTRAVENOUS

## 2012-12-08 MED ORDER — PRAMIPEXOLE DIHYDROCHLORIDE 1.5 MG PO TABS
1.5000 mg | ORAL_TABLET | Freq: Every day | ORAL | Status: DC
  Administered 2012-12-08 – 2012-12-13 (×6): 1.5 mg via ORAL
  Filled 2012-12-08: qty 2
  Filled 2012-12-08 (×6): qty 1

## 2012-12-08 MED ORDER — WARFARIN - PHARMACIST DOSING INPATIENT: Freq: Every day | Status: DC

## 2012-12-08 MED ORDER — LORAZEPAM 0.5 MG PO TABS
0.5000 mg | ORAL_TABLET | Freq: Four times a day (QID) | ORAL | Status: DC | PRN
  Administered 2012-12-14: 0.5 mg via ORAL
  Filled 2012-12-08: qty 1

## 2012-12-08 MED ORDER — EZETIMIBE 10 MG PO TABS
10.0000 mg | ORAL_TABLET | Freq: Every evening | ORAL | Status: DC
  Administered 2012-12-08: 10 mg via ORAL
  Filled 2012-12-08 (×3): qty 1

## 2012-12-08 MED ORDER — ZOLPIDEM TARTRATE 5 MG PO TABS
5.0000 mg | ORAL_TABLET | Freq: Every evening | ORAL | Status: DC | PRN
  Administered 2012-12-08: 5 mg via ORAL
  Filled 2012-12-08: qty 1

## 2012-12-08 MED ORDER — SODIUM CHLORIDE 0.9 % IV SOLN
Freq: Once | INTRAVENOUS | Status: AC
  Administered 2012-12-08: 16:00:00 via INTRAVENOUS

## 2012-12-08 MED ORDER — WARFARIN SODIUM 5 MG PO TABS
5.0000 mg | ORAL_TABLET | Freq: Once | ORAL | Status: DC
  Filled 2012-12-08: qty 1

## 2012-12-08 MED ORDER — FOLIC ACID 1 MG PO TABS
1.0000 mg | ORAL_TABLET | Freq: Every day | ORAL | Status: DC
  Administered 2012-12-08: 1 mg via ORAL
  Filled 2012-12-08 (×2): qty 1

## 2012-12-08 MED ORDER — POTASSIUM CHLORIDE CRYS ER 20 MEQ PO TBCR
20.0000 meq | EXTENDED_RELEASE_TABLET | Freq: Every day | ORAL | Status: DC
  Administered 2012-12-08 – 2012-12-14 (×7): 20 meq via ORAL
  Filled 2012-12-08 (×7): qty 1

## 2012-12-08 MED ORDER — OXYCODONE HCL 5 MG PO TABS
5.0000 mg | ORAL_TABLET | ORAL | Status: DC | PRN
  Administered 2012-12-08 – 2012-12-09 (×3): 5 mg via ORAL
  Filled 2012-12-08 (×3): qty 1

## 2012-12-08 MED ORDER — BENZONATATE 100 MG PO CAPS
200.0000 mg | ORAL_CAPSULE | Freq: Three times a day (TID) | ORAL | Status: DC | PRN
  Administered 2012-12-12 (×2): 200 mg via ORAL
  Filled 2012-12-08 (×2): qty 2

## 2012-12-08 MED ORDER — PANTOPRAZOLE SODIUM 40 MG PO TBEC
40.0000 mg | DELAYED_RELEASE_TABLET | Freq: Every day | ORAL | Status: DC
  Administered 2012-12-08 – 2012-12-14 (×7): 40 mg via ORAL
  Filled 2012-12-08 (×7): qty 1

## 2012-12-08 MED ORDER — ACETAMINOPHEN 325 MG PO TABS
650.0000 mg | ORAL_TABLET | Freq: Four times a day (QID) | ORAL | Status: DC | PRN
  Administered 2012-12-09: 650 mg via ORAL
  Filled 2012-12-08: qty 2

## 2012-12-08 MED ORDER — HEPARIN (PORCINE) IN NACL 100-0.45 UNIT/ML-% IJ SOLN
1100.0000 [IU]/h | INTRAMUSCULAR | Status: DC
  Administered 2012-12-08: 1100 [IU]/h via INTRAVENOUS
  Filled 2012-12-08: qty 250

## 2012-12-08 MED ORDER — PRAMIPEXOLE DIHYDROCHLORIDE 1 MG PO TABS
1.0000 mg | ORAL_TABLET | Freq: Two times a day (BID) | ORAL | Status: DC
  Administered 2012-12-08 – 2012-12-14 (×13): 1 mg via ORAL
  Filled 2012-12-08 (×16): qty 1

## 2012-12-08 MED ORDER — OXYCODONE HCL 5 MG PO CAPS: 5.0000 mg | ORAL_CAPSULE | ORAL | Status: DC | PRN

## 2012-12-08 MED ORDER — ONDANSETRON HCL 4 MG/2ML IJ SOLN
4.0000 mg | Freq: Four times a day (QID) | INTRAMUSCULAR | Status: DC | PRN
  Administered 2012-12-08 – 2012-12-13 (×4): 4 mg via INTRAVENOUS
  Filled 2012-12-08 (×4): qty 2

## 2012-12-08 MED ORDER — PYRIDOSTIGMINE BROMIDE 60 MG PO TABS
60.0000 mg | ORAL_TABLET | Freq: Three times a day (TID) | ORAL | Status: DC
  Administered 2012-12-08 – 2012-12-14 (×19): 60 mg via ORAL
  Filled 2012-12-08 (×21): qty 1

## 2012-12-08 MED ORDER — IOHEXOL 300 MG/ML  SOLN
80.0000 mL | Freq: Once | INTRAMUSCULAR | Status: AC | PRN
  Administered 2012-12-08: 80 mL via INTRAVENOUS

## 2012-12-08 MED ORDER — ADULT MULTIVITAMIN W/MINERALS CH
1.0000 | ORAL_TABLET | Freq: Every day | ORAL | Status: DC
  Administered 2012-12-08 – 2012-12-13 (×6): 1 via ORAL
  Filled 2012-12-08 (×7): qty 1

## 2012-12-08 MED ORDER — LIDOCAINE-PRILOCAINE 2.5-2.5 % EX CREA
1.0000 "application " | TOPICAL_CREAM | CUTANEOUS | Status: DC | PRN
  Filled 2012-12-08: qty 5

## 2012-12-08 NOTE — ED Notes (Signed)
Pt has Lung Ca schedule CT and PET today.  Pt transferred from CT- DX known PE. Denies CP at present.  EDP Bernette Mayers in to see pt.  Pt presents with NAD

## 2012-12-08 NOTE — Progress Notes (Signed)
UR completed 

## 2012-12-08 NOTE — H&P (Addendum)
[facility] History and Physical  POETRY CERRO EAV:[id number] DOB: 04-25-1946 DOA: 12/08/2012  Referring physician: EDP PCP: none currently Onc- Josph Macho, MD   Chief Complaint: PE on staging CT  HPI: [name] Pacheco is a 67 y.o. 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 She also has h/o of neuropathy and parkinson like symptoms She is just s/p her second chemo cycle and reports progressive weakness and nausea today. In addition she also reports  dyspnea with exertion, intermittent pain in hers legs and pleuritic pain in both lower chest for past few days. Dopplers done in ER note extensive bilateral DVT     Review of Systems: The patient denies anorexia, fever, weight loss,, vision loss, decreased hearing, hoarseness, chest pain, syncope, dyspnea on exertion, peripheral edema, balance deficits, hemoptysis, abdominal pain, melena, hematochezia, severe indigestion/heartburn, hematuria, incontinence, genital sores, muscle weakness, suspicious skin lesions, transient blindness, difficulty walking, depression, unusual weight change, abnormal bleeding, enlarged lymph nodes, angioedema, and breast masses.    Past Medical History  Diagnosis Date  . Hypertension   . Hyperlipidemia   . Parkinson disease   . Metastatic lung carcinoma 09/10/2012  . Shoulder pain   . Lung cancer   . Sleep disturbance   . Hx of radiation therapy 09/29/12 - 10/19/12    sternal lesion 37.5 gray x 15 fx, LL lung area- 37.5 gray x 15 fx   Past Surgical History  Procedure Laterality Date  . Tonsillectomy    . Shoulder surgery Left 2012    arthroscopy, debridement   Social History:  reports that she quit smoking about 20 years ago. Her smoking use included Cigarettes. She smoked 0.00 packs per day  for 10 years. She has never used smokeless tobacco. She reports that she does not drink alcohol or use illicit drugs. Lives at home with spouse, uses a walker recently to ambulate  No Known Allergies  Family History  Problem Relation Age of Onset  . Sudden death Mother   . Sudden death Father   . Heart attack Father   . Rheum arthritis Son     Prior to Admission medications   Medication Sig Start Date End Date Taking? Authorizing Provider  Alum & Mag Hydroxide-Simeth (MAGIC MOUTHWASH) SOLN Swish and spit 15 mLs 3 (three) times daily as needed (for thrush).   Yes Historical Provider, MD  benzonatate (TESSALON) 200 MG capsule Take 1 capsule (200 mg total) by mouth 3 (three) times daily as needed for cough. 11/11/12  Yes Eunice Blase, PA-C  Calcium Carbonate-Vitamin D (CALTRATE 600+D PO) Take 1 tablet by mouth 2 (two) times daily.   Yes Historical Provider, MD  Cholecalciferol (VITAMIN D) 2000 UNITS CAPS Take 2,000 Units by mouth daily.    Yes Historical Provider, MD  ezetimibe (ZETIA) 10 MG tablet Take 10 mg by mouth every evening.   Yes Historical Provider, MD  folic acid (FOLVITE) 1 MG tablet Take 1 mg by mouth daily.   Yes Historical Provider, MD  lidocaine-prilocaine (EMLA) cream Apply 1 application topically as needed (for port-a-cath access).  10/22/12  Yes Historical Provider, MD  LORazepam (ATIVAN) 0.5 MG tablet Take 1 tablet (0.5 mg total) by mouth every 6 (six) hours as needed (Nausea or vomiting). 10/21/12  Yes Josph Macho, MD  Lysine 500 MG TABS  Take 500 mg by mouth daily as needed (out breaks).    Yes Historical Provider, MD  Multiple Vitamin (MULTIVITAMIN WITH MINERALS) TABS Take 1 tablet by mouth daily.   Yes Historical Provider, MD  omeprazole (PRILOSEC) 20 MG capsule Take 20 mg by mouth daily.   Yes Historical Provider, MD  ondansetron (ZOFRAN) 8 MG tablet Take 8 mg by mouth every 12 (twelve) hours as needed for nausea.   Yes Historical Provider, MD  oxycodone (OXY-IR) 5 MG  capsule Take 1 capsule (5 mg total) by mouth every 4 (four) hours as needed for pain. 11/16/12  Yes Billie Lade, MD  potassium chloride SA (K-DUR,KLOR-CON) 20 MEQ tablet Take 20 mEq by mouth daily.   Yes Historical Provider, MD  pramipexole (MIRAPEX) 1 MG tablet Take 1-1.5 mg by mouth 3 (three) times daily. 1 tablets bid and 1.5 tablets at bedtime   Yes Historical Provider, MD  PRESCRIPTION MEDICATION Inject into the vein every 21 ( twenty-one) days. Alimta/Paraplatin infusion every 3 weeks.   Yes Historical Provider, MD  prochlorperazine (COMPAZINE) 10 MG tablet Take 10 mg by mouth every 6 (six) hours as needed (for nausea/vomiting).   Yes Historical Provider, MD  pyridostigmine (MESTINON) 60 MG tablet Take 60 mg by mouth 3 (three) times daily. 10/06/12  Yes Levert Feinstein, MD  triamterene-hydrochlorothiazide (MAXZIDE-25) 37.5-25 MG per tablet Take 1 tablet by mouth daily.   Yes Historical Provider, MD  valACYclovir (VALTREX) 500 MG tablet Take 500 mg by mouth daily as needed (for outbreaks).  09/29/12  Yes Historical Provider, MD  zolpidem (AMBIEN CR) 6.25 MG CR tablet Take 6.25 mg by mouth at bedtime.  11/10/12  Yes Historical Provider, MD   Physical Exam: Filed Vitals:   12/08/12 1315  BP: 105/61  Pulse: 103  Temp: 98.4 F (36.9 C)  TempSrc: Oral  Resp: 19  SpO2: 94%     General:  AAOx3, no distress, visibly quite upset  HEENT: PERRLA, EOMI, oral mucosa pale  Cardiovascular: S1S2/RRR  Respiratory: CTAB  Abdomen: soft, NT, BS present  Skin: no rashes or skin breakdown  Musculoskeletal: no edema c/c  Psychiatric: appropriate mood and affect  Neurologic: moves all ext, no localising signs  Labs on Admission:  Basic Metabolic Panel:  Recent Labs Lab 12/08/12 1400  NA 128*  K 4.5  CL 92*  CO2 26  GLUCOSE 112*  BUN 10  CREATININE 0.54  CALCIUM 8.8   Liver Function Tests: No results found for this basename: AST, ALT, ALKPHOS, BILITOT, PROT, ALBUMIN,  in the last 168  hours No results found for this basename: LIPASE, AMYLASE,  in the last 168 hours No results found for this basename: AMMONIA,  in the last 168 hours CBC:  Recent Labs Lab 12/08/12 1400  WBC 9.2  NEUTROABS 6.9  HGB 7.7*  HCT 24.7*  MCV 91.8  PLT 314   Cardiac Enzymes:  Recent Labs Lab 12/08/12 1400  TROPONINI <0.30    BNP (last 3 results) No results found for this basename: PROBNP,  in the last 8760 hours CBG:  Recent Labs Lab 12/08/12 1000  GLUCAP 133*    Radiological Exams on Admission: Ct Chest W Contrast  12/08/2012   *RADIOLOGY REPORT*  Clinical Data: Chemotherapy for lung cancer.  Radiation therapy complete.  Chest pain.  Cough.  Shortness of breath.  History of Parkinson's disease.  CT CHEST WITH CONTRAST  Technique:  Multidetector CT imaging of the chest was performed following the standard protocol during bolus  administration of intravenous contrast.  Contrast: 80mL OMNIPAQUE IOHEXOL 300 MG/ML  SOLN  Comparison: Today's PET.  Prior PET of 09/09/2012 and CT of 08/31/2012.  Findings: Lungs/pleura: Decreased size of central left lower lobe lung nodule.  This measures 2.8 x 1.7 cm today versus 3.0 x 4.0 cm at the same level on the prior exam.  Left paramediastinal interstitial opacity with traction bronchiectasis, likely radiation induced.  Other scattered areas of interstitial opacity identified throughout both lungs.  Somewhat more confluent at the left lower lobe, including image 30/series 7.  No pleural fluid.   1.1 cm left-sided pleural nodule which corresponds to hypermetabolism at PET.  Image 20 today.  Heart/Mediastinum: Incompletely imaged low left jugular/supraclavicular adenopathy which corresponds to hypermetabolism at PET. Right-sided Port-A-Cath which terminates at the low SVC.  Normal heart size, without pericardial effusion.  Large central pulmonary embolism, with emboli identified within both pulmonary arteries and the segmental branches lower lobes. Example  images 21 - 24/series 2.  No evidence of right heart strain.  Subtle node inferior to the left inferior pulmonary vein corresponds to hypermetabolism.  This measures 1.0 cm on image 27.  Upper abdomen: New right liver lobe 9 mm lesion image 48/series 2, corresponding hypermetabolism at PET. More inferior right hepatic lobe 8 mm lesion on image 4/series 4.  Normal adrenal glands.  Bones/Musculoskeletal:  Sternal manubrial metastasis again identified.  Soft tissue density in the subcutaneous fat superficially likely the site of biopsy.  Extensive osseous metastasis are otherwise relatively CT occult.  IMPRESSION:  1.  Large burden central pulmonary embolism.  Findings were discussed with Dr. Gustavo Lah physician's assistant, Inetta Fermo, at 12 5:00 p.m. I also discussed findings with the patient and she was sent to the emergency room. 2.  Decreased left lower lobe lung nodule size. 3.  Development of thoracic and lower cervical nodal metastasis. 4.  Progression of osseous metastasis, most apparent at PET. 5.  Development of hepatic metastasis. 6.  Left pleural metastasis, new. 7. At least partially radiation used interstitial opacity throughout the lungs.  Difficult to exclude drug toxicity or early lymphangitic tumor spread.   Original Report Authenticated By: Jeronimo Greaves, M.D.   Nm Pet Image Restag (ps) Skull Base To Thigh  12/08/2012   *RADIOLOGY REPORT*  Clinical Data: Subsequent treatment strategy for restaging of metastatic left-sided lung cancer.  Status post chemotherapy.  NUCLEAR MEDICINE PET SKULL BASE TO THIGH  Fasting Blood Glucose:  133  Technique:  19.5 mCi F-18 FDG was injected intravenously. CT data was obtained and used for attenuation correction and anatomic localization only.  (This was not acquired as a diagnostic CT examination.) Additional exam technical data entered on technologist worksheet.  Comparison:  09/09/2012  Findings:  Neck: A hypermetabolic right lower neck subcutaneous nodule which  measures 8 mm and a S.U.V. max of 5.5 on image 35/series 2. Development of left supraclavicular/low cervical adenopathy.  1.2 cm and a S.U.V. max of 15.7 on image 42/series 2.  Chest:  Hypermetabolism corresponding to the left perimediastinal interstitial opacity which is favored to be radiation induced.  New hypermetabolism about the low left posterior mediastinum.  This likely corresponds to a necrotic 11 mm node on image 27/series 2 of today's CT.  This measures a S.U.V. max of 10.5.  A new left-sided pleural hypermetabolic nodule on image 78.  Central left lower lobe lung nodule is decreased in size, measures 2.3 cm today versus 4.5 cm on the prior exam.  This measures a S.U.V. max  of 12.6.  Abdomen/Pelvis:  Development of hepatic metastasis.  Index high right hepatic lobe lesion measures 1.1 cm (on the diagnostic CT of same date) and a S.U.V. max of 13.7 on image 110.  Probable physiologic hypermetabolism within the cecum.Low level hypermetabolism within the left inguinal region corresponds to edema surrounding the left external iliac and common femoral veins.  Skeleton:  Progression of osseous metastasis.  Multiple new lesions.  Index lesion in the left glenoid measures a S.U.V. max of 6.7 on image 53/series 2.  There is hypermetabolism about the sternum and along the presumed biopsy tract.  Pathologic fracture identified, as before.  New lesions also identified within the right iliac and sacral bones.  CT  images performed for attenuation correction demonstrate no further findings within the neck.  Chest findings deferred to today's diagnostic CTs.  Right nephrolithiasis.  Right hepatic lobe cyst.  Pelvic floor laxity.  Cystic lesion within the right adnexa which is similar in size at 6.0 cm.  IMPRESSION:  1.  Decreased size of left lower lobe lung nodule. 2.  Development of nodal metastasis within the left mediastinum and low left neck. 3.  New hepatic, left pleural and subcutaneous metastasis. 4.   Progression of osseous metastasis. 5.  Left groin edema and hypermetabolism, likely the site of deep venous thrombosis (please see diagnostic chest CT report for description of pulmonary embolism).   Original Report Authenticated By: Jeronimo Greaves, M.D.    EKG: Independently reviewed. Sinus tachycardia, no acute ST t wave changes  Assessment/Plan Active Problems:   Pulmonary embolism   Metastatic lung carcinoma   Parkinson disease   1. Large Central PE -start IV heparin stat -i called and d/w dr.Ennever, he will Fu in am -admit to SDU now, given extent of clot burden -once stable for 24-48 hours could transition to SQ lovenox which she will need indefinitely  2. Stage 4 adenoCa of lung -unfortunately PET scan showing progression of disease -further management per Dr.Ennever  3. Anemia: -likely from chronic disease and chemotherapy -transfuse if <7  4. Htn: -hold diuretic  5. Neuropathy -continue Pamipexole  6. Pain from 2/mets -continue home pain regimen  DVt porph: on therapeutic anticoagulation   Code Status: DNR Family Communication: d/w pt, husband and son at bedside Disposition Plan: SDU  Time spent:  [facility] Pager [phone]  If 7PM-7AM, please contact night-coverage [email or web address] Password [facility] 12/08/2012, 3:39 PM

## 2012-12-08 NOTE — ED Provider Notes (Signed)
History    CSN: [id number] Arrival date & time 12/08/12  1306  First MD Initiated Contact with Patient 12/08/12 1320     Chief Complaint  Patient presents with  . Shortness of Breath    from CT with known PE   (Consider location/radiation/quality/duration/timing/severity/associated sxs/prior Treatment) Patient is a 67 y.o. female presenting with shortness of breath.  Shortness of Breath  Pt with history of lung cancer s/p radiation treatment and now getting chemo had a CT and PET today for surveillance and found to have central PE. She was brought from CT to the ED for evaluation. She denies CP, but reports some exertional SOB for the last several weeks since beginning treatment. No cough or fever. She denies LE swelling but has had occasional aching pain in L medial thigh the last week or so.   Past Medical History  Diagnosis Date  . Hypertension   . Hyperlipidemia   . Parkinson disease   . Metastatic lung carcinoma 09/10/2012  . Shoulder pain   . Lung cancer   . Sleep disturbance   . Hx of radiation therapy 09/29/12 - 10/19/12    sternal lesion 37.5 gray x 15 fx, LL lung area- 37.5 gray x 15 fx   Past Surgical History  Procedure Laterality Date  . Tonsillectomy    . Shoulder surgery Left 2012    arthroscopy, debridement   Family History  Problem Relation Age of Onset  . Sudden death Mother   . Sudden death Father   . Heart attack Father   . Rheum arthritis Son    History  Substance Use Topics  . Smoking status: Former Smoker -- 10 years    Types: Cigarettes    Quit date: 06/17/1992  . Smokeless tobacco: Never Used  . Alcohol Use: No   OB History   Grav Para Term Preterm Abortions TAB SAB Ect Mult Living                 Review of Systems  Respiratory: Positive for shortness of breath.    All other systems reviewed and are negative except as noted in HPI.   Allergies  Review of patient's allergies indicates no known allergies.  Home Medications    Current Outpatient Rx  Name  Route  Sig  Dispense  Refill  . Alum & Mag Hydroxide-Simeth (MAGIC MOUTHWASH) SOLN   Swish & Spit   Swish and spit 15 mLs 3 (three) times daily as needed (for thrush).         . folic acid (FOLVITE) 1 MG tablet   Oral   Take 1 mg by mouth daily.         . ondansetron (ZOFRAN) 8 MG tablet   Oral   Take 8 mg by mouth every 12 (twelve) hours as needed for nausea.         . potassium chloride SA (K-DUR,KLOR-CON) 20 MEQ tablet   Oral   Take 20 mEq by mouth daily.         Marland Kitchen PRESCRIPTION MEDICATION   Intravenous   Inject into the vein every 21 ( twenty-one) days. Alimta/Paraplatin infusion every 3 weeks.         . prochlorperazine (COMPAZINE) 10 MG tablet   Oral   Take 10 mg by mouth every 6 (six) hours as needed (for nausea/vomiting).         . triamterene-hydrochlorothiazide (MAXZIDE-25) 37.5-25 MG per tablet   Oral   Take 1 tablet by mouth daily.  BP 105/61  Pulse 103  Temp(Src) 98.4 F (36.9 C) (Oral)  Resp 19  SpO2 94% Physical Exam  Nursing note and vitals reviewed. Constitutional: She is oriented to person, place, and time. She appears well-developed and well-nourished.  HENT:  Head: Normocephalic and atraumatic.  Eyes: EOM are normal. Pupils are equal, round, and reactive to light.  Neck: Normal range of motion. Neck supple.  Cardiovascular: Normal heart sounds and intact distal pulses.  Tachycardia present.   Pulmonary/Chest: Effort normal and breath sounds normal.  Abdominal: Bowel sounds are normal. She exhibits no distension. There is no tenderness.  Musculoskeletal: Normal range of motion. She exhibits tenderness (over the LLE deep venous system). She exhibits no edema.  Neurological: She is alert and oriented to person, place, and time. She has normal strength. No cranial nerve deficit or sensory deficit.  Skin: Skin is warm and dry. No rash noted.  Psychiatric: She has a normal mood and affect.    ED  Course  Procedures (including critical care time) Labs Reviewed  GLUCOSE, CAPILLARY - Abnormal; Notable for the following:    Glucose-Capillary 133 (*)    All other components within normal limits  CBC WITH DIFFERENTIAL - Abnormal; Notable for the following:    RBC 2.69 (*)    Hemoglobin 7.7 (*)    HCT 24.7 (*)    RDW 21.2 (*)    Lymphocytes Relative 7 (*)    Monocytes Relative 18 (*)    Lymphs Abs 0.6 (*)    Monocytes Absolute 1.7 (*)    All other components within normal limits  BASIC METABOLIC PANEL - Abnormal; Notable for the following:    Sodium 128 (*)    Chloride 92 (*)    Glucose, Bld 112 (*)    All other components within normal limits  PROTIME-INR  APTT  TROPONIN I  HEPARIN LEVEL (UNFRACTIONATED)   Ct Chest W Contrast  12/08/2012   *RADIOLOGY REPORT*  Clinical Data: Chemotherapy for lung cancer.  Radiation therapy complete.  Chest pain.  Cough.  Shortness of breath.  History of Parkinson's disease.  CT CHEST WITH CONTRAST  Technique:  Multidetector CT imaging of the chest was performed following the standard protocol during bolus administration of intravenous contrast.  Contrast: 80mL OMNIPAQUE IOHEXOL 300 MG/ML  SOLN  Comparison: Today's PET.  Prior PET of 09/09/2012 and CT of 08/31/2012.  Findings: Lungs/pleura: Decreased size of central left lower lobe lung nodule.  This measures 2.8 x 1.7 cm today versus 3.0 x 4.0 cm at the same level on the prior exam.  Left paramediastinal interstitial opacity with traction bronchiectasis, likely radiation induced.  Other scattered areas of interstitial opacity identified throughout both lungs.  Somewhat more confluent at the left lower lobe, including image 30/series 7.  No pleural fluid.   1.1 cm left-sided pleural nodule which corresponds to hypermetabolism at PET.  Image 20 today.  Heart/Mediastinum: Incompletely imaged low left jugular/supraclavicular adenopathy which corresponds to hypermetabolism at PET. Right-sided Port-A-Cath which  terminates at the low SVC.  Normal heart size, without pericardial effusion.  Large central pulmonary embolism, with emboli identified within both pulmonary arteries and the segmental branches lower lobes. Example images 21 - 24/series 2.  No evidence of right heart strain.  Subtle node inferior to the left inferior pulmonary vein corresponds to hypermetabolism.  This measures 1.0 cm on image 27.  Upper abdomen: New right liver lobe 9 mm lesion image 48/series 2, corresponding hypermetabolism at PET. More inferior right hepatic lobe  8 mm lesion on image 4/series 4.  Normal adrenal glands.  Bones/Musculoskeletal:  Sternal manubrial metastasis again identified.  Soft tissue density in the subcutaneous fat superficially likely the site of biopsy.  Extensive osseous metastasis are otherwise relatively CT occult.  IMPRESSION:  1.  Large burden central pulmonary embolism.  Findings were discussed with Dr. Gustavo Lah physician's assistant, Inetta Fermo, at 12 5:00 p.m. I also discussed findings with the patient and she was sent to the emergency room. 2.  Decreased left lower lobe lung nodule size. 3.  Development of thoracic and lower cervical nodal metastasis. 4.  Progression of osseous metastasis, most apparent at PET. 5.  Development of hepatic metastasis. 6.  Left pleural metastasis, new. 7. At least partially radiation used interstitial opacity throughout the lungs.  Difficult to exclude drug toxicity or early lymphangitic tumor spread.   Original Report Authenticated By: Jeronimo Greaves, M.D.   Nm Pet Image Restag (ps) Skull Base To Thigh  12/08/2012   *RADIOLOGY REPORT*  Clinical Data: Subsequent treatment strategy for restaging of metastatic left-sided lung cancer.  Status post chemotherapy.  NUCLEAR MEDICINE PET SKULL BASE TO THIGH  Fasting Blood Glucose:  133  Technique:  19.5 mCi F-18 FDG was injected intravenously. CT data was obtained and used for attenuation correction and anatomic localization only.  (This was not  acquired as a diagnostic CT examination.) Additional exam technical data entered on technologist worksheet.  Comparison:  09/09/2012  Findings:  Neck: A hypermetabolic right lower neck subcutaneous nodule which measures 8 mm and a S.U.V. max of 5.5 on image 35/series 2. Development of left supraclavicular/low cervical adenopathy.  1.2 cm and a S.U.V. max of 15.7 on image 42/series 2.  Chest:  Hypermetabolism corresponding to the left perimediastinal interstitial opacity which is favored to be radiation induced.  New hypermetabolism about the low left posterior mediastinum.  This likely corresponds to a necrotic 11 mm node on image 27/series 2 of today's CT.  This measures a S.U.V. max of 10.5.  A new left-sided pleural hypermetabolic nodule on image 78.  Central left lower lobe lung nodule is decreased in size, measures 2.3 cm today versus 4.5 cm on the prior exam.  This measures a S.U.V. max of 12.6.  Abdomen/Pelvis:  Development of hepatic metastasis.  Index high right hepatic lobe lesion measures 1.1 cm (on the diagnostic CT of same date) and a S.U.V. max of 13.7 on image 110.  Probable physiologic hypermetabolism within the cecum.Low level hypermetabolism within the left inguinal region corresponds to edema surrounding the left external iliac and common femoral veins.  Skeleton:  Progression of osseous metastasis.  Multiple new lesions.  Index lesion in the left glenoid measures a S.U.V. max of 6.7 on image 53/series 2.  There is hypermetabolism about the sternum and along the presumed biopsy tract.  Pathologic fracture identified, as before.  New lesions also identified within the right iliac and sacral bones.  CT  images performed for attenuation correction demonstrate no further findings within the neck.  Chest findings deferred to today's diagnostic CTs.  Right nephrolithiasis.  Right hepatic lobe cyst.  Pelvic floor laxity.  Cystic lesion within the right adnexa which is similar in size at 6.0 cm.   IMPRESSION:  1.  Decreased size of left lower lobe lung nodule. 2.  Development of nodal metastasis within the left mediastinum and low left neck. 3.  New hepatic, left pleural and subcutaneous metastasis. 4.  Progression of osseous metastasis. 5.  Left groin edema and  hypermetabolism, likely the site of deep venous thrombosis (please see diagnostic chest CT report for description of pulmonary embolism).   Original Report Authenticated By: Jeronimo Greaves, M.D.   1. Metastatic lung carcinoma, left   2. Pulmonary embolism   3. Anemia     MDM   Date: 12/08/2012  Rate: 103  Rhythm: sinus tachycardia  QRS Axis: normal  Intervals: normal  ST/T Wave abnormalities: nonspecific T wave changes  Conduction Disutrbances:none  Narrative Interpretation:   Old EKG Reviewed: changes noted, faster rate  Reviewed CT images with radiologist, shows a central PE, but no right heart strain. Formal report not yet transcribed at that time but available now. Hemodynamically stable, but anemia is also significantly worse from previous.Will admit for anticoagulation, evaluation of anemia and LE doppler.    Yussuf Sawyers B. Bernette Mayers, MD 12/08/12 1556

## 2012-12-08 NOTE — Progress Notes (Addendum)
ANTICOAGULATION CONSULT NOTE - Initial Consult  Pharmacy Consult for Heparin and Coumadin Indication: pulmonary embolus  No Known Allergies  Patient Measurements:   Heparin Dosing Weight: 70kg  Vital Signs: Temp: 98.4 F (36.9 C) (06/24 1315) Temp src: Oral (06/24 1315) BP: 105/61 mmHg (06/24 1315) Pulse Rate: 103 (06/24 1315)  Labs:  Recent Labs  12/08/12 1400  HGB 7.7*  HCT 24.7*  PLT 314  APTT 30  LABPROT 15.2  INR 1.22  CREATININE 0.54  TROPONINI <0.30    The CrCl is unknown because both a height and weight (above a minimum accepted value) are required for this calculation.   Medical History: Past Medical History  Diagnosis Date  . Hypertension   . Hyperlipidemia   . Parkinson disease   . Metastatic lung carcinoma 09/10/2012  . Shoulder pain   . Lung cancer   . Sleep disturbance   . Hx of radiation therapy 09/29/12 - 10/19/12    sternal lesion 37.5 gray x 15 fx, LL lung area- 37.5 gray x 15 fx    Medications:   (Not in a hospital admission) Scheduled:    Assessment: 66yo F with lung cancer with SOB. CT revealed PE. Pharmacy is asked to start heparin infusion and Coumadin. Recent weight 74kg. Height 64". aPTT wnl. INR slightly greater than 1. H/H low but no overt bleeding reported.  Goal of Therapy:  INR 2-3 Heparin level 0.3-0.7 units/ml Monitor platelets by anticoagulation protocol: Yes   Plan:   Give heparin 2000 units x 1 then 1100 units/hr.  Check heparin level in 8 hrs.  Check CBC q48h.  Start Coumadin with 5mg  today at 1800.   Check INR daily.  Provide Coumadin education.  Please note: CHEST guidelines recommend Lovenox monotherapy for treatment of VTE in patients with active malignancy.  Charolotte Eke, PharmD, pager [phone]. 12/08/2012,3:06 PM.  Addendum:  Coumadin protocol was discontinued. No doses were given.   Charolotte Eke, PharmD, pager [phone]. 12/08/2012,3:56 PM.

## 2012-12-08 NOTE — Progress Notes (Signed)
VASCULAR LAB PRELIMINARY  PRELIMINARY  PRELIMINARY  PRELIMINARY  Bilateral lower extremity venous duplex  completed.    Preliminary report:  Right:  DVT noted in the distal CFV through the PTV and peroneal veins.  No evidence of superficial thrombosis.  No Baker's cyst.  Left: DVT noted in the CFV through the PTV and peroneal veins and including the gasgrocnemius veins.  No evidence of superficial thrombosis.  No Baker's cyst.   Beena Catano, RVT 12/08/2012, 3:05 PM

## 2012-12-08 NOTE — Progress Notes (Addendum)
ED CM notified [facility] staff that pt will be admitted to follow for any d/c needs Response back from [place] that referral received

## 2012-12-09 ENCOUNTER — Ambulatory Visit: Payer: Medicare Other

## 2012-12-09 ENCOUNTER — Other Ambulatory Visit: Payer: Medicare Other | Admitting: Lab

## 2012-12-09 ENCOUNTER — Ambulatory Visit: Payer: Medicare Other | Admitting: Hematology & Oncology

## 2012-12-09 DIAGNOSIS — E43 Unspecified severe protein-calorie malnutrition: Secondary | ICD-10-CM | POA: Insufficient documentation

## 2012-12-09 DIAGNOSIS — I82409 Acute embolism and thrombosis of unspecified deep veins of unspecified lower extremity: Secondary | ICD-10-CM

## 2012-12-09 DIAGNOSIS — C7800 Secondary malignant neoplasm of unspecified lung: Secondary | ICD-10-CM | POA: Insufficient documentation

## 2012-12-09 DIAGNOSIS — I82403 Acute embolism and thrombosis of unspecified deep veins of lower extremity, bilateral: Secondary | ICD-10-CM | POA: Diagnosis present

## 2012-12-09 DIAGNOSIS — D649 Anemia, unspecified: Secondary | ICD-10-CM | POA: Diagnosis present

## 2012-12-09 LAB — CBC
HCT: 22.2 % — ABNORMAL LOW (ref 36.0–46.0)
Hemoglobin: 7.3 g/dL — ABNORMAL LOW (ref 12.0–15.0)
MCH: 30.3 pg (ref 26.0–34.0)
MCHC: 32.9 g/dL (ref 30.0–36.0)
MCV: 92.1 fL (ref 78.0–100.0)
RBC: 2.41 MIL/uL — ABNORMAL LOW (ref 3.87–5.11)

## 2012-12-09 LAB — HEPARIN LEVEL (UNFRACTIONATED)
Heparin Unfractionated: 0.35 IU/mL (ref 0.30–0.70)
Heparin Unfractionated: 0.17 IU/mL — ABNORMAL LOW (ref 0.30–0.70)

## 2012-12-09 LAB — BASIC METABOLIC PANEL
BUN: 7 mg/dL (ref 6–23)
CO2: 25 mEq/L (ref 19–32)
Calcium: 8.3 mg/dL — ABNORMAL LOW (ref 8.4–10.5)
Creatinine, Ser: 0.5 mg/dL (ref 0.50–1.10)
GFR calc non Af Amer: >90 mL/min (ref >90)
Glucose, Bld: 109 mg/dL — ABNORMAL HIGH (ref 70–99)

## 2012-12-09 MED ORDER — FUROSEMIDE 10 MG/ML IJ SOLN: 10.0000 mg | Freq: Once | INTRAMUSCULAR | Status: AC

## 2012-12-09 MED ORDER — HYDROMORPHONE HCL PF 1 MG/ML IJ SOLN
1.0000 mg | INTRAMUSCULAR | Status: DC | PRN
  Administered 2012-12-09: 1 mg via INTRAVENOUS
  Filled 2012-12-09: qty 1

## 2012-12-09 MED ORDER — HEPARIN BOLUS VIA INFUSION
2000.0000 [IU] | Freq: Once | INTRAVENOUS | Status: AC
  Filled 2012-12-09: qty 2000

## 2012-12-09 MED ORDER — HYDROMORPHONE HCL PF 1 MG/ML IJ SOLN
1.0000 mg | INTRAMUSCULAR | Status: DC | PRN
  Administered 2012-12-10 – 2012-12-11 (×12): 1 mg via INTRAVENOUS
  Filled 2012-12-09 (×12): qty 1

## 2012-12-09 MED ORDER — HEPARIN BOLUS VIA INFUSION
1000.0000 [IU] | Freq: Once | INTRAVENOUS | Status: AC
  Administered 2012-12-09: 1000 [IU] via INTRAVENOUS
  Filled 2012-12-09: qty 1000

## 2012-12-09 MED ORDER — ACETAMINOPHEN 325 MG PO TABS
650.0000 mg | ORAL_TABLET | Freq: Once | ORAL | Status: AC
Start: 2012-12-09 — End: 2012-12-09
  Administered 2012-12-09: 650 mg via ORAL
  Filled 2012-12-09: qty 2

## 2012-12-09 MED ORDER — BOOST / RESOURCE BREEZE PO LIQD
1.0000 | Freq: Three times a day (TID) | ORAL | Status: DC
  Administered 2012-12-09 – 2012-12-14 (×9): 1 via ORAL

## 2012-12-09 MED ORDER — BIOTENE DRY MOUTH MT LIQD
15.0000 mL | Freq: Two times a day (BID) | OROMUCOSAL | Status: DC
  Administered 2012-12-10 – 2012-12-13 (×4): 15 mL via OROMUCOSAL

## 2012-12-09 MED ORDER — OXYCODONE HCL 5 MG PO TABS
5.0000 mg | ORAL_TABLET | ORAL | Status: DC | PRN
  Administered 2012-12-09 (×2): 5 mg via ORAL
  Filled 2012-12-09 (×2): qty 1

## 2012-12-09 MED ORDER — HEPARIN (PORCINE) IN NACL 100-0.45 UNIT/ML-% IJ SOLN
1400.0000 [IU]/h | INTRAMUSCULAR | Status: DC
  Administered 2012-12-09: 2000 [IU]/h via INTRAVENOUS
  Administered 2012-12-09: 1400 [IU]/h via INTRAVENOUS
  Filled 2012-12-09 (×3): qty 250

## 2012-12-09 MED ORDER — FUROSEMIDE 10 MG/ML IJ SOLN
INTRAMUSCULAR | Status: AC
  Administered 2012-12-09: 10 mg via INTRAVENOUS
  Filled 2012-12-09: qty 4

## 2012-12-09 MED ORDER — HEPARIN (PORCINE) IN NACL 100-0.45 UNIT/ML-% IJ SOLN
1600.0000 [IU]/h | INTRAMUSCULAR | Status: DC
  Administered 2012-12-10 – 2012-12-11 (×3): 1600 [IU]/h via INTRAVENOUS
  Filled 2012-12-09 (×4): qty 250

## 2012-12-09 NOTE — Progress Notes (Signed)
CARE MANAGEMENT NOTE 12/09/2012  Patient:  [name] Pacheco, [name] Pacheco   Account Number:  [id number]  Date Initiated:  12/09/2012  Documentation initiated by:  Lennin Osmond  Subjective/Objective Assessment:   pt with large pulmonary emboli, bilateral dvts of the lower legs,     Action/Plan:   lives at home with spouse use [facility] needs-agency is aware of patient admission   Anticipated DC Date:  12/12/2012   Anticipated DC Plan:  HOME W HOME HEALTH SERVICES  In-house referral  NA      DC Planning Services  CM consult      [facility] Choice  NA   Choice offered to / List presented to:  NA   DME arranged  NA      DME agency  [facility]     [facility] arranged  NA      HH agency  [facility]   Status of service:  In process, will continue to follow Medicare Important Message given?  NA - LOS <3 / Initial given by admissions (If response is "NO", the following Medicare IM given date fields will be blank) Date Medicare IM given:   Date Additional Medicare IM given:    Discharge Disposition:    Per UR Regulation:  Reviewed for med. necessity/level of care/duration of stay  If discussed at Long Length of Stay Meetings, dates discussed:    Comments:  [id number] Earlene Plater, RN, BSN, CCM:  CHART REVIEWED AND UPDATED.  Next chart review due on [id number]. NO DISCHARGE NEEDS PRESENT AT THIS TIME. CASE MANAGEMENT [phone]

## 2012-12-09 NOTE — Progress Notes (Signed)
[facility]  Patient Status: Active (receiving services up to time of hospitalization)  [facility] is providing the following services: PT  If patient discharges after hours, please call [phone].   Lanae Crumbly 12/09/2012, 12:07 PM

## 2012-12-09 NOTE — Progress Notes (Signed)
INITIAL NUTRITION ASSESSMENT  Pt meets criteria for severe MALNUTRITION in the context of chronic illness as evidenced by <75% estimated energy intake in the past month with 11.2% weight loss in the past 3 months per pt report.  DOCUMENTATION CODES Per approved criteria  -Severe malnutrition in the context of chronic illness   INTERVENTION: - Resource Breeze BID as pt getting tired of Ensure - Discussed different strategies to help with changes in taste - Will continue to monitor   NUTRITION DIAGNOSIS: Inadequate oral intake related to poor appetite, taste changes as evidenced by <25% meal intake.   Goal: Pt to consume >90% of meals/supplements  Monitor:  Weights, labs, intake  Reason for Assessment: Nutrition risk   67 y.o. female  Admitting Dx: Pulmonary embolism   ASSESSMENT: Pt discussed during multidisciplinary rounds. Pt with metastatic lung CA s/p radiation therapy and 2 cycles of chemotherapy. Met with pt and husband who report pt has unintentionally lost 20 pounds since CA diagnosis in March. Pt reports she was eating well, 3-4 small meals/day and drinking Ensure regularly before her 2nd round of chemo. Pt reports the chemo made her nauseated and it lasted for a week PTA during which time pt ate very little. Pt reports she was using Compazine at home twice/day but it made her sleepy. Pt c/o changes in taste but denies any metallic taste. Pt ate only a few bites of tray this morning. Pt with low sodium.    Height: Ht Readings from Last 1 Encounters:  12/08/12 5\' 3"  (1.6 m)    Weight: Wt Readings from Last 1 Encounters:  12/09/12 158 lb 11.7 oz (72 kg)    Ideal Body Weight: 115 lb  % Ideal Body Weight: 137  Wt Readings from Last 10 Encounters:  12/09/12 158 lb 11.7 oz (72 kg)  11/19/12 162 lb (73.483 kg)  11/16/12 162 lb 14.4 oz (73.891 kg)  11/11/12 158 lb (71.668 kg)  11/06/12 157 lb (71.215 kg)  07/10/12 169 lb (76.658 kg)  10/26/12 155 lb (70.308 kg)   10/19/12 154 lb 14.4 oz (70.262 kg)  10/14/12 154 lb 6.4 oz (70.035 kg)  10/08/12 155 lb (70.308 kg)    Usual Body Weight: 178 lb in March 2014 per pt  % Usual Body Weight: 89  BMI:  Body mass index is 28.13 kg/(m^2).  Estimated Nutritional Needs: Kcal: 1450-1800 Protein: 55-65g Fluid: 1.4-1.8L/day  Skin: Intact   Diet Order: General  EDUCATION NEEDS: -Education needs addressed - discussed strategies to help with changes in taste from chemo   Intake/Output Summary (Last 24 hours) at 12/09/12 1326 Last data filed at 12/09/12 1300  Gross per 24 hour  Intake 2034.5 ml  Output    750 ml  Net 1284.5 ml    Last BM: 6/23  Labs:   Recent Labs Lab 12/08/12 1400 12/09/12 0420  NA 128* 131*  K 4.5 4.0  CL 92* 96  CO2 26 25  BUN 10 7  CREATININE 0.54 0.50  CALCIUM 8.8 8.3*  GLUCOSE 112* 109*    CBG (last 3)   Recent Labs  12/08/12 1000  GLUCAP 133*    Scheduled Meds: . antiseptic oral rinse  15 mL Mouth Rinse BID  . furosemide  10 mg Intravenous Once  . multivitamin with minerals  1 tablet Oral Daily  . pantoprazole  40 mg Oral Daily  . potassium chloride SA  20 mEq Oral Daily  . pramipexole  1 mg Oral BID AC  . pramipexole  1.5 mg Oral QHS  . pyridostigmine  60 mg Oral TID    Continuous Infusions: . heparin 1,400 Units/hr (12/09/12 0746)    Past Medical History  Diagnosis Date  . Hypertension   . Hyperlipidemia   . Parkinson disease   . Metastatic lung carcinoma 09/10/2012  . Shoulder pain   . Lung cancer   . Sleep disturbance   . Hx of radiation therapy 09/29/12 - 10/19/12    sternal lesion 37.5 gray x 15 fx, LL lung area- 37.5 gray x 15 fx    Past Surgical History  Procedure Laterality Date  . Tonsillectomy    . Shoulder surgery Left 2012    arthroscopy, debridement     Levon Hedger MS, RD, LDN [phone] Pager [phone] After Hours Pager

## 2012-12-09 NOTE — Consult Note (Signed)
[name], [name] NO.:  [id number]  MEDICAL RECORD NO.:  [id number]  LOCATION:  1227                         FACILITY:  [facility]  PHYSICIAN:  Josph Macho, M.D.  DATE OF BIRTH:  08/03/1945  DATE OF CONSULTATION:  12/09/2012 DATE OF DISCHARGE:                                CONSULTATION   REFERRING PHYSICIAN:  Marcellus Scott, MD.  REASON FOR CONSULTATION: 1. Metastatic lung cancer. 2. Pulmonary embolism/DVT.  HISTORY OF PRESENT ILLNESS:  [name] Pacheco is well known to me.  She is a very nice 67 year old white female.  She is a past smoker.  She has metastatic adenocarcinoma of the lung.  She unfortunately does not contain any mutation.  We had started her on chemotherapy.  She has received 2 cycles of Alimta/carboplatin/Avastin.  She had a tough time with chemotherapy.  She uses a walker to get around.  She has some weakness.  She received her last cycle of chemotherapy back on June 6.  We then went ahead and did repeat staging studies on her.  These were done yesterday.  Unfortunately, she has progressive disease.  Just as important, she now has a large central pulmonary embolism.  She has emboli also noted in both lungs.  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  Dopplers done shows DVT involving right common femoral vein down to the right peroneal vein.  Also seen is DVT in the left common femoral vein down to the left gastrocnemius vein. She was started on heparin infusion.  Repeat PET scan showed progressive disease.  She has a new metastasis in mediastinum and left neck.  She has new hepatic mets.  There is left pleural and subcutaneous metastasis.  She has progression of osseous metastasis.  She has previously received radiation to the sternum because of the sternal met.  This is how she actually presented.  She was admitted by the hospitalist to the stepdown unit.  I am gratefully in appreciation of them for their efforts.  Her lab work when she came  in shows sodium 128, potassium 4.5, calcium was 8.8 with an albumin that was not done.  Her CBC showed a white cell count of 9.2, hemoglobin 7.7, and platelet count 314,000.  Today, her hemoglobin is 7.3.  I think she probably will need some blood.  I had a long talk with her today about the results of the scan.  PAST MEDICAL HISTORY:  Remarkable for: 1. Hypertension. 2. Hyperlipidemia.  ALLERGIES:  None.  ADMISSION MEDICATIONS:  Tessalon Perles 200 mg p.o. t.i.d. p.r.n., Zetia 10 mg p.o. daily, folic acid 1 mg p.o. daily, Ativan 0.5 mg p.o. q.6 h. p.r.n. nausea and vomiting, Prilosec 20 mg p.o. daily, OxyIR 5 mg p.o. q.4 h. p.r.n. pain, Mirapex 1.5 mg p.o. t.i.d., Mestinon 60 mg p.o. t.i.d., Maxzide (37.5/25) one p.o. daily, Valtrex 500 mg p.o. daily p.r.n., Ambien CR 6.25 mg p.o. at bedtime.  SOCIAL HISTORY:  She has a remote history of tobacco use.  I do not think she has smoked apart for 20-25 years.  There is no alcohol use. There is no obvious occupational exposures.  FAMILY HISTORY:  Noncontributory.  PHYSICAL EXAMINATION:  GENERAL:  This is a slightly cushingoid appearing white female, in no obvious distress. VITAL SIGNS:  Temperature of 99.7, pulse 100, respiratory rate 21, blood pressure 102/52. HEAD:  A normocephalic, atraumatic skull.  There are no ocular or oral lesions.  Again, she has a slight cushingoid face.  She has no adenopathy in the neck.  There is a subcutaneous nodule in the right posterior supraclavicular region.  This probably measures about 5 mm. It is mobile, slightly tender.  Her lungs are clear bilaterally.  No wheezes are noted. CARDIAC:  Tachycardic, but regular.  She has no murmurs, rubs, or bruits.  Chest wall exam does show a subcutaneous met in the left upper sternal region.  Again, this is slightly erythematous and tender. ABDOMEN:  Soft.  She has good bowel sounds.  There is no palpable hepatosplenomegaly. EXTREMITIES:  Some trace edema  in her legs.  She may have a little bit more in the left leg than the right leg.  She has good pulses.  She has good range of motion of her joints. NEUROLOGICAL:  Shows no focal neurological deficits.  LABORATORY STUDIES:  Her laboratory study today show white cell count of 8.6, hemoglobin 7.3, hematocrit 22.2, and platelet count 310.  Sodium 131, potassium 4, BUN 7, and creatinine 0.5.  IMPRESSION:  [name] Pacheco is a very nice 67 year old white female with metastatic adenocarcinoma of the lung.  She has had 2 cycles of chemotherapy.  She has progressed.  This clearly is an ominous factor. In addition, she has pulmonary emboli and DVT.  This also is a poor prognostic factor.  I will check her pre-albumin level.  I suspect that this probably will be on the low side.  I talked to her about the metastatic lung cancer that has progressed. Her performance status right now currently is ECOG 2/3.  It is going to be difficult to give her as a second-line therapy.  However, I just want to see how she does over the next week or so.  I think if she does improve a little bit with her performance status, we might be able to try second-line therapy.  However, second-line therapy, I think, will have a response rate of less than 25%.  We clearly cannot give her Avastin.  It is hard to say whether or not the Avastin was a factor in the thromboembolic episode.  I do want to get an MRI of her brain, so that we can rule out any obvious brain mets.  I think this is going to be important.  Somehow, would not surprise me if she did have brain mets.  As far as the pulmonary emboli/DVT are concerned, she will need an outpatient anticoagulation with Arixtra.  If her insurance does not pay for Arixtra, then Lovenox would be appropriate.  I would not use Coumadin on her as Coumadin with malignancy associated thromboembolic disease is not that effective.  We did discuss her code status.  She continues to  desire no heroic measures.  I totally agree with this.   I will probably keep her on heparin for right now.  Therefore, I gave her a couple days of heparin and then try to switch her over to a low- molecular weight heparin or Arixtra.  Again, Arixtra would be once a day, which will be more convenient for her.  I does really feel bad for Ms. Krach.  She is incredibly nice.  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  This is just tough personally for me to see that she is not responding.  We will certainly follow her along.  Again, I gratefully appreciated the outstanding care that she is being given by the hospitalist and the nursing staff down in the ICU.     Josph Macho, M.D.     PRE/MEDQ  D:  12/09/2012  T:  12/09/2012  Job:  [id number]

## 2012-12-09 NOTE — Consult Note (Signed)
#   [id number] is consult note.  Proverbs 3:5-6

## 2012-12-09 NOTE — Progress Notes (Signed)
Patient alert and oriented.  Patient's temperature noted to be 100.5 axillary while receiving second unit RBC.  There was about 25mL of RBCs remaining to be administered.  Dr. Bennie Pierini notified of temperature and transfusion status.  Dr. Waymon Amato ordered to give a PO dose of tylenol and complete transfusion.  Orders completed.  Will continue to monitor.  Kinnie Feil, RN

## 2012-12-09 NOTE — Progress Notes (Addendum)
ANTICOAGULATION CONSULT NOTE - Follow Up  Pharmacy Consult for Heparin IV Indication: pulmonary embolus  No Known Allergies  Patient Measurements: Height: 5\' 3"  (160 cm) Weight: 158 lb 11.7 oz (72 kg) IBW/kg (Calculated) : 52.4 Heparin Dosing Weight: 70kg  Vital Signs: Temp: 98.1 F (36.7 C) (06/25 0800) Temp src: Oral (06/25 0800) BP: 96/67 mmHg (06/25 0740) Pulse Rate: 96 (06/25 0740)  Labs:  Recent Labs  12/08/12 1400 12/08/12 2316 12/09/12 0420 12/09/12 0820  HGB 7.7*  --  7.3*  --   HCT 24.7*  --  22.2*  --   PLT 314  --  310  --   APTT 30  --   --   --   LABPROT 15.2  --   --   --   INR 1.22  --   --   --   HEPARINUNFRC  --  0.11*  --  0.35  CREATININE 0.54  --  0.50  --   TROPONINI <0.30  --   --   --     Estimated Creatinine Clearance: 66.6 ml/min (by C-G formula based on Cr of 0.5).   Medical History: Past Medical History  Diagnosis Date  . Hypertension   . Hyperlipidemia   . Parkinson disease   . Metastatic lung carcinoma 09/10/2012  . Shoulder pain   . Lung cancer   . Sleep disturbance   . Hx of radiation therapy 09/29/12 - 10/19/12    sternal lesion 37.5 gray x 15 fx, LL lung area- 37.5 gray x 15 fx    Medications:  Prescriptions prior to admission  Medication Sig Dispense Refill  . Alum & Mag Hydroxide-Simeth (MAGIC MOUTHWASH) SOLN Swish and spit 15 mLs 3 (three) times daily as needed (for thrush).      . benzonatate (TESSALON) 200 MG capsule Take 1 capsule (200 mg total) by mouth 3 (three) times daily as needed for cough.  50 capsule  0  . Calcium Carbonate-Vitamin D (CALTRATE 600+D PO) Take 1 tablet by mouth 2 (two) times daily.      . Cholecalciferol (VITAMIN D) 2000 UNITS CAPS Take 2,000 Units by mouth daily.       Marland Kitchen ezetimibe (ZETIA) 10 MG tablet Take 10 mg by mouth every evening.      . folic acid (FOLVITE) 1 MG tablet Take 1 mg by mouth daily.      Marland Kitchen lidocaine-prilocaine (EMLA) cream Apply 1 application topically as needed (for  port-a-cath access).       . LORazepam (ATIVAN) 0.5 MG tablet Take 1 tablet (0.5 mg total) by mouth every 6 (six) hours as needed (Nausea or vomiting).  30 tablet  0  . Lysine 500 MG TABS Take 500 mg by mouth daily as needed (out breaks).       . Multiple Vitamin (MULTIVITAMIN WITH MINERALS) TABS Take 1 tablet by mouth daily.      Marland Kitchen omeprazole (PRILOSEC) 20 MG capsule Take 20 mg by mouth daily.      . ondansetron (ZOFRAN) 8 MG tablet Take 8 mg by mouth every 12 (twelve) hours as needed for nausea.      Marland Kitchen oxycodone (OXY-IR) 5 MG capsule Take 1 capsule (5 mg total) by mouth every 4 (four) hours as needed for pain.  50 capsule  0  . potassium chloride SA (K-DUR,KLOR-CON) 20 MEQ tablet Take 20 mEq by mouth daily.      . pramipexole (MIRAPEX) 1 MG tablet Take 1-1.5 mg by mouth 3 (three)  times daily. 1 tablets bid and 1.5 tablets at bedtime      . PRESCRIPTION MEDICATION Inject into the vein every 21 ( twenty-one) days. Alimta/Paraplatin infusion every 3 weeks.      . prochlorperazine (COMPAZINE) 10 MG tablet Take 10 mg by mouth every 6 (six) hours as needed (for nausea/vomiting).      . pyridostigmine (MESTINON) 60 MG tablet Take 60 mg by mouth 3 (three) times daily.      Marland Kitchen triamterene-hydrochlorothiazide (MAXZIDE-25) 37.5-25 MG per tablet Take 1 tablet by mouth daily.      . valACYclovir (VALTREX) 500 MG tablet Take 500 mg by mouth daily as needed (for outbreaks).       . zolpidem (AMBIEN CR) 6.25 MG CR tablet Take 6.25 mg by mouth at bedtime.        Scheduled:  . acetaminophen  650 mg Oral Once  . furosemide  10 mg Intravenous Once  . multivitamin with minerals  1 tablet Oral Daily  . pantoprazole  40 mg Oral Daily  . potassium chloride SA  20 mEq Oral Daily  . pramipexole  1 mg Oral BID AC  . pramipexole  1.5 mg Oral QHS  . pyridostigmine  60 mg Oral TID    Assessment: 67yo F with lung cancer with SOB. CT revealed PE. Pharmacy is asked to start heparin infusion. aPTT wnl at baseline and INR  slightly greater than 1. H/H low but no overt bleeding reported.  Heparin level now therapeutic (0.35) after rebolus overnight and increase in rate from 11 ml/hr to 14 ml/hr secondary subtherapeutic level of 0.11  No reported bleeding  H/H low but stable compared to yesterday  Noted plan that patient needs brain MRI - of note, pumps that are compatible for use during MRI to continue IV infusion meds while getting MRI have been recalled so if heparin continued the drip would have to suspended while getting MRI then restarted when able after MRI completed  Goal of Therapy:  INR 2-3 Heparin level 0.3-0.7 units/ml Monitor platelets by anticoagulation protocol: Yes   Plan:   Continue current heparin IV rate of 14 ml/hr  Will need to recheck heparin level at 2pm to confirm goal level at current rate  Wait plan per Dr. Myna Hidalgo for MRI - recommend changing to Lovenox as stated below - would recommend 1.5mg /kg (~100mg ) SQ q24  Note per oncologist suggests switching to Arixtra vs Lovenox but after discussion with case manager the cost of Arixtra for patient would be much greater than Lovenox and will likely need to be on the Lovenox match program as well. Contacted Dr. Myna Hidalgo about this and he stated that changing to whichever is cheaper is fine with him. He stated that he would still like to continue IV heparin for at least 48hr prior to changing to Lovenox - will await orders for transition beginning tomorrow  Hessie Knows, PharmD, BCPS Pager [phone] 12/09/2012 8:50 AM

## 2012-12-09 NOTE — Progress Notes (Signed)
Brief Pharmacy Update:  Pharmacy Consult for Heparin IV  Indication: pulmonary embolus   Please see full consult note written earlier today for complete details on consult.  Tonight, heparin level subtherapeutic @ 0.17.   Heparin infusion rate running at 1400 units/hr.   Pt received 2 units blood today, level checked ~ 2 hours after administration of PRBCs completed.  No issues with line and no bleeding noted per RN.  Heparin dosing weight = 67.5kg  Plan: Bolus heparin 1000 units, then increase heparin rate to 1600 units/hr.  Check heparin level with AM labs.    Haynes Hoehn, PharmD 12/09/2012 9:43 PM  Pager: [phone]

## 2012-12-09 NOTE — Progress Notes (Addendum)
[facility] PROGRESS NOTE  [name] Pacheco ZOX:[id number] DOB: 04-16-1946 DOA: 12/08/2012 PCP: Josph Macho, MD  Brief narrative 67 y.o. 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 She also has h/o of neuropathy and parkinson like symptoms  She is just s/p her second chemo cycle and reports progressive weakness and nausea today.  In addition she also reports dyspnea with exertion, intermittent pain in hers legs and pleuritic pain in both lower chest for past few days.  Dopplers done in ER note extensive bilateral DVT    Assessment/Plan: 1. Large Central PE & bilateral lower extremity DVT: started on IV heparin. Oncology notes appreciated and discussed with Dr. Myna Hidalgo who recommends transitioning to either Arixtra or Lovenox depending on patient's insurance coverage. Asymptomatic & hemodynamically stable. Not sure if Avastin played a role in the TEE. 2. Stage 4 adenoCa of lung: Oncology consultation appreciated. She is status post 2 cycles of chemotherapy and has progressed on recent imaging. He wishes to wait for a week or 2 before trying second line therapy. MRI brain to be done when able-per radiology, current MRI machine unable to accommodate heparin pump. 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 3. Anemia: likely from chronic disease, cancer and chemotherapy. Transfusing 2 units PRBCs per oncology. 4. Hypertension: Controlled. Holding diuretics for now. 5. Parkinson's disease: Continue Pramipexole and prior to stay green.  6. Pain from 2/mets: Continue home pain regimen 7. Hyponatremia: Patient appears euvolemic or even a little volume overloaded. This may be secondary to HCTZ or SIADH. HCTZ currently on hold. Monitor BMP. Getting a dose  of IV Lasix today which should help.   Code Status: DO NOT RESUSCITATE Family Communication: Discussed with spouse at the bedside. Disposition Plan: Continue to monitor and step down unit for additional 24 hours.   Consultants:  Medical oncology  Procedures:  None  Antibiotics:  None   HPI/Subjective: Mild, 4/10 right-sided back pain which improved after pain medications. Denies dyspnea, chest pain or bleeding.  Objective: Filed Vitals:   12/09/12 0400 12/09/12 0535 12/09/12 0740 12/09/12 0800  BP:  102/52 96/67   Pulse:  100 96   Temp: 99.7 F (37.6 C)   98.1 F (36.7 C)  TempSrc: Oral   Oral  Resp:  21 17   Height:      Weight: 72 kg (158 lb 11.7 oz)     SpO2:  97% 97%     Intake/Output Summary (Last 24 hours) at 12/09/12 0915 Last data filed at 12/09/12 0900  Gross per 24 hour  Intake   1001 ml  Output    750 ml  Net    251 ml   Filed Weights   12/08/12 1710 12/09/12 0400  Weight: 71 kg (156 lb 8.4 oz) 72 kg (158 lb 11.7 oz)    Exam:   General exam: Comfortable.  Respiratory system: Clear. No increased work of breathing.  Cardiovascular system: S1 & S2 heard, RRR. No JVD, murmurs, gallops, clicks. Trace bilateral leg edema.  Gastrointestinal system: Abdomen is nondistended, soft and nontender. Normal bowel sounds heard.  Central nervous system: Alert and oriented. No focal neurological deficits.  Extremities: Symmetric 5 x 5 power. No asymmetric leg swellings. Peripheral pulses symmetrically felt.   Data Reviewed: Basic Metabolic Panel:  Recent Labs Lab  12/08/12 1400 12/09/12 0420  NA 128* 131*  K 4.5 4.0  CL 92* 96  CO2 26 25  GLUCOSE 112* 109*  BUN 10 7  CREATININE 0.54 0.50  CALCIUM 8.8 8.3*   Liver Function Tests: No results found for this basename: AST, ALT, ALKPHOS, BILITOT, PROT, ALBUMIN,  in the last 168 hours No results found for this basename: LIPASE, AMYLASE,  in the last 168 hours No results found for this basename:  AMMONIA,  in the last 168 hours CBC:  Recent Labs Lab 12/08/12 1400 12/09/12 0420  WBC 9.2 8.6  NEUTROABS 6.9  --   HGB 7.7* 7.3*  HCT 24.7* 22.2*  MCV 91.8 92.1  PLT 314 310   Cardiac Enzymes:  Recent Labs Lab 12/08/12 1400  TROPONINI <0.30   BNP (last 3 results) No results found for this basename: PROBNP,  in the last 8760 hours CBG:  Recent Labs Lab 12/08/12 1000  GLUCAP 133*    Recent Results (from the past 240 hour(s))  MRSA PCR SCREENING     Status: None   Collection Time    12/08/12  5:23 PM      Result Value Range Status   MRSA by PCR NEGATIVE  NEGATIVE Final   Comment:            The GeneXpert MRSA Assay (FDA     approved for NASAL specimens     only), is one component of a     comprehensive MRSA colonization     surveillance program. It is not     intended to diagnose MRSA     infection nor to guide or     monitor treatment for     MRSA infections.     Studies: Ct Chest W Contrast  12/08/2012   *RADIOLOGY REPORT*  Clinical Data: Chemotherapy for lung cancer.  Radiation therapy complete.  Chest pain.  Cough.  Shortness of breath.  History of Parkinson's disease.  CT CHEST WITH CONTRAST  Technique:  Multidetector CT imaging of the chest was performed following the standard protocol during bolus administration of intravenous contrast.  Contrast: 80mL OMNIPAQUE IOHEXOL 300 MG/ML  SOLN  Comparison: Today's PET.  Prior PET of 09/09/2012 and CT of 08/31/2012.  Findings: Lungs/pleura: Decreased size of central left lower lobe lung nodule.  This measures 2.8 x 1.7 cm today versus 3.0 x 4.0 cm at the same level on the prior exam.  Left paramediastinal interstitial opacity with traction bronchiectasis, likely radiation induced.  Other scattered areas of interstitial opacity identified throughout both lungs.  Somewhat more confluent at the left lower lobe, including image 30/series 7.  No pleural fluid.   1.1 cm left-sided pleural nodule which corresponds to  hypermetabolism at PET.  Image 20 today.  Heart/Mediastinum: Incompletely imaged low left jugular/supraclavicular adenopathy which corresponds to hypermetabolism at PET. Right-sided Port-A-Cath which terminates at the low SVC.  Normal heart size, without pericardial effusion.  Large central pulmonary embolism, with emboli identified within both pulmonary arteries and the segmental branches lower lobes. Example images 21 - 24/series 2.  No evidence of right heart strain.  Subtle node inferior to the left inferior pulmonary vein corresponds to hypermetabolism.  This measures 1.0 cm on image 27.  Upper abdomen: New right liver lobe 9 mm lesion image 48/series 2, corresponding hypermetabolism at PET. More inferior right hepatic lobe 8 mm lesion on image 4/series 4.  Normal adrenal glands.  Bones/Musculoskeletal:  Sternal manubrial metastasis again identified.  Soft tissue density  in the subcutaneous fat superficially likely the site of biopsy.  Extensive osseous metastasis are otherwise relatively CT occult.  IMPRESSION:  1.  Large burden central pulmonary embolism.  Findings were discussed with Dr. Gustavo Lah physician's assistant, Inetta Fermo, at 12 5:00 p.m. I also discussed findings with the patient and she was sent to the emergency room. 2.  Decreased left lower lobe lung nodule size. 3.  Development of thoracic and lower cervical nodal metastasis. 4.  Progression of osseous metastasis, most apparent at PET. 5.  Development of hepatic metastasis. 6.  Left pleural metastasis, new. 7. At least partially radiation used interstitial opacity throughout the lungs.  Difficult to exclude drug toxicity or early lymphangitic tumor spread.   Original Report Authenticated By: Jeronimo Greaves, M.D.   Nm Pet Image Restag (ps) Skull Base To Thigh  12/08/2012   *RADIOLOGY REPORT*  Clinical Data: Subsequent treatment strategy for restaging of metastatic left-sided lung cancer.  Status post chemotherapy.  NUCLEAR MEDICINE PET SKULL BASE TO  THIGH  Fasting Blood Glucose:  133  Technique:  19.5 mCi F-18 FDG was injected intravenously. CT data was obtained and used for attenuation correction and anatomic localization only.  (This was not acquired as a diagnostic CT examination.) Additional exam technical data entered on technologist worksheet.  Comparison:  09/09/2012  Findings:  Neck: A hypermetabolic right lower neck subcutaneous nodule which measures 8 mm and a S.U.V. max of 5.5 on image 35/series 2. Development of left supraclavicular/low cervical adenopathy.  1.2 cm and a S.U.V. max of 15.7 on image 42/series 2.  Chest:  Hypermetabolism corresponding to the left perimediastinal interstitial opacity which is favored to be radiation induced.  New hypermetabolism about the low left posterior mediastinum.  This likely corresponds to a necrotic 11 mm node on image 27/series 2 of today's CT.  This measures a S.U.V. max of 10.5.  A new left-sided pleural hypermetabolic nodule on image 78.  Central left lower lobe lung nodule is decreased in size, measures 2.3 cm today versus 4.5 cm on the prior exam.  This measures a S.U.V. max of 12.6.  Abdomen/Pelvis:  Development of hepatic metastasis.  Index high right hepatic lobe lesion measures 1.1 cm (on the diagnostic CT of same date) and a S.U.V. max of 13.7 on image 110.  Probable physiologic hypermetabolism within the cecum.Low level hypermetabolism within the left inguinal region corresponds to edema surrounding the left external iliac and common femoral veins.  Skeleton:  Progression of osseous metastasis.  Multiple new lesions.  Index lesion in the left glenoid measures a S.U.V. max of 6.7 on image 53/series 2.  There is hypermetabolism about the sternum and along the presumed biopsy tract.  Pathologic fracture identified, as before.  New lesions also identified within the right iliac and sacral bones.  CT  images performed for attenuation correction demonstrate no further findings within the neck.  Chest  findings deferred to today's diagnostic CTs.  Right nephrolithiasis.  Right hepatic lobe cyst.  Pelvic floor laxity.  Cystic lesion within the right adnexa which is similar in size at 6.0 cm.  IMPRESSION:  1.  Decreased size of left lower lobe lung nodule. 2.  Development of nodal metastasis within the left mediastinum and low left neck. 3.  New hepatic, left pleural and subcutaneous metastasis. 4.  Progression of osseous metastasis. 5.  Left groin edema and hypermetabolism, likely the site of deep venous thrombosis (please see diagnostic chest CT report for description of pulmonary embolism).   Original Report  Authenticated By: Jeronimo Greaves, M.D.   VASCULAR LAB  PRELIMINARY PRELIMINARY PRELIMINARY PRELIMINARY   Bilateral lower extremity venous duplex completed.   Preliminary report: Right: DVT noted in the distal CFV through the PTV and peroneal veins. No evidence of superficial thrombosis. No Baker's cyst. Left: DVT noted in the CFV through the PTV and peroneal veins and including the gasgrocnemius veins. No evidence of superficial thrombosis. No Baker's cyst.    Additional labs:   Scheduled Meds: . acetaminophen  650 mg Oral Once  . furosemide  10 mg Intravenous Once  . multivitamin with minerals  1 tablet Oral Daily  . pantoprazole  40 mg Oral Daily  . potassium chloride SA  20 mEq Oral Daily  . pramipexole  1 mg Oral BID AC  . pramipexole  1.5 mg Oral QHS  . pyridostigmine  60 mg Oral TID   Continuous Infusions: . heparin 1,400 Units/hr (12/09/12 0746)    Active Problems:   Metastatic lung carcinoma   Parkinson disease   Pulmonary embolism    Time spent: 45 minutes.    [facility]  [facility] Pager [phone].   If 8PM-8AM, please contact night-coverage at [email or web address], password [facility] 12/09/2012, 9:15 AM  LOS: 1 day

## 2012-12-10 ENCOUNTER — Other Ambulatory Visit: Payer: Self-pay | Admitting: *Deleted

## 2012-12-10 DIAGNOSIS — I2699 Other pulmonary embolism without acute cor pulmonale: Secondary | ICD-10-CM

## 2012-12-10 DIAGNOSIS — C7800 Secondary malignant neoplasm of unspecified lung: Secondary | ICD-10-CM

## 2012-12-10 LAB — TYPE AND SCREEN
ABO/RH(D): B NEG
Antibody Screen: NEGATIVE
Unit division: 0

## 2012-12-10 LAB — BASIC METABOLIC PANEL
BUN: 5 mg/dL — ABNORMAL LOW (ref 6–23)
CO2: 24 mEq/L (ref 19–32)
Calcium: 8.6 mg/dL (ref 8.4–10.5)
Glucose, Bld: 147 mg/dL — ABNORMAL HIGH (ref 70–99)
Sodium: 132 mEq/L — ABNORMAL LOW (ref 135–145)

## 2012-12-10 LAB — CBC
HCT: 30.7 % — ABNORMAL LOW (ref 36.0–46.0)
Hemoglobin: 9.9 g/dL — ABNORMAL LOW (ref 12.0–15.0)
MCHC: 32.2 g/dL (ref 30.0–36.0)
MCV: 90 fL (ref 78.0–100.0)
RDW: 20.1 % — ABNORMAL HIGH (ref 11.5–15.5)

## 2012-12-10 LAB — HEPARIN LEVEL (UNFRACTIONATED): Heparin Unfractionated: 0.47 IU/mL (ref 0.30–0.70)

## 2012-12-10 NOTE — Progress Notes (Signed)
ANTICOAGULATION CONSULT NOTE - Follow Up Consult  Pharmacy Consult for heparin Indication: pulmonary embolus  No Known Allergies  Patient Measurements: Height: 5\' 3"  (160 cm) Weight: 158 lb 11.7 oz (72 kg) IBW/kg (Calculated) : 52.4 Heparin Dosing Weight:   Vital Signs: Temp: 98.3 F (36.8 C) (06/26 0800) Temp src: Oral (06/26 0800) BP: 105/70 mmHg (06/26 1331) Pulse Rate: 92 (06/26 1331)  Labs:  Recent Labs  12/08/12 1400  12/09/12 0420  12/09/12 2040 12/10/12 0345 12/10/12 1305  HGB 7.7*  --  7.3*  --   --  9.9*  --   HCT 24.7*  --  22.2*  --   --  30.7*  --   PLT 314  --  310  --   --  318  --   APTT 30  --   --   --   --   --   --   LABPROT 15.2  --   --   --   --   --   --   INR 1.22  --   --   --   --   --   --   HEPARINUNFRC  --   < >  --   < > 0.17* 0.52 0.47  CREATININE 0.54  --  0.50  --   --  0.43*  --   TROPONINI <0.30  --   --   --   --   --   --   < > = values in this interval not displayed.  Estimated Creatinine Clearance: 66.6 ml/min (by C-G formula based on Cr of 0.43).   Medications:  Infusions:  . heparin 1,600 Units/hr (12/10/12 0137)    Assessment: [name] Pacheco with lung cancer, admitted 6/25 with SOB. Found to have large PE and bilateral DVTs. Initiated on IV heparin.  Heparin level therapeutic x 2 today on 1600 units/hr  H/H improved after transfusion, Pltc stable, no bleeding reported  Noted Oncology plan to convert to Lovenox 1.5mg /kg sq q24h tomorrow  Goal of Therapy:  Heparin level 0.3-0.7 units/ml Monitor platelets by anticoagulation protocol: Yes   Plan:   Continue heparin 1600 units/hr  Heparin level and CBC in am  Loralee Pacas, PharmD, BCPS Pager: [phone] 12/10/2012,1:46 PM

## 2012-12-10 NOTE — Progress Notes (Signed)
To refer to Hospice per Dr Myna Hidalgo. Order entered in the EMR. Faxed copy of referral to [facility].

## 2012-12-10 NOTE — Progress Notes (Signed)
[facility] PROGRESS NOTE  [name] Pacheco AVW:[id number] DOB: [birth year] DOA: 12/08/2012 PCP: Josph Macho, MD  Brief narrative 67 y.o. female Metastatic lung cancer-adenocarcinoma (wild type 4 EGFR/ALK/ROS), s/p XRT and 2 cycles of chemotherapy went for a staging Ct and PEt scan that showed showed Large Central PE with emboli in both main pulmonary arteries and bilateral lower extremity DVT on venous Doppler. She also has h/o of neuropathy and parkinson like symptoms. She is just s/p her second chemo cycle and reports progressive weakness and nausea. In addition she also reports dyspnea with exertion, intermittent pain in hers legs and pleuritic pain in both lower chest for past few days.    Assessment/Plan: 1. Large Central PE & bilateral lower extremity DVT: started on IV heparin. Oncology managing anticoagulation and recommend additional day of IV heparin and then switch to once daily Lovenox. Asymptomatic & hemodynamically stable. Not sure if Avastin played a role in the TEE. Discussed with oncology and okay to transfer to regular bed. Low-grade fevers may be secondary to VTE. 2. Stage 4 adenoCa of lung: Oncology consultation appreciated. She is status post 2 cycles of chemotherapy and has progressed on recent imaging. 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 As per oncology and discussion with the patient and family-patient has decided not to take any further chemotherapy. Discussed with oncology who recommended no further need for MRI brain- Canceled. Oncology will arrange for hospice. 3. Anemia: likely from chronic disease, cancer and chemotherapy. Status post 2 units PRBCs on 6/25-improved. 4. Hypertension: Controlled. Holding diuretics for now. Intermittent soft blood pressures-asymptomatic.? Secondary to pain medications. Not on any antihypertensives currently. Monitor. 5. Parkinson's disease: Continue Pramipexole and Pyridostigmine.   6. Pain from 2/mets: Pain controlled on Dilaudid IV when necessary. 7. Hyponatremia: Patient appears euvolemic or even a little volume overloaded. This may be secondary to HCTZ or SIADH. HCTZ currently on hold. Status post Lasix x1 on 6/25. Sodium stable.   Code Status: DO NOT RESUSCITATE Family Communication: Discussed with spouse at the bedside. Disposition Plan: Transfer to medical bed. PT evaluation. Possible discharge on 6/27 or as recommended by primary oncologist.   Consultants:  Medical oncology  Procedures:  None  Antibiotics:  None   HPI/Subjective: Denies dyspnea, chest pain or bleeding. Intermittent pleuritic-type right lower chest pain 8/10-improves to 2/10 after pain medications  Objective: Filed Vitals:   12/09/12 2002 12/10/12 0000 12/10/12 0400 12/10/12 0800  BP: 84/57 82/54 96/58  113/69  Pulse: 99 86 95 93  Temp:  98.5 F (36.9 C) 99.4 F (37.4 C) 98.3 F (36.8 C)  TempSrc:  Oral Oral Oral  Resp: 15 17 20 16   Height:      Weight:      SpO2: 96% 96% 95% 97%    Intake/Output Summary (Last 24 hours) at 12/10/12 1246 Last data filed at 12/10/12 1100  Gross per 24 hour  Intake 2345.5 ml  Output   1350 ml  Net  995.5 ml   Filed Weights   12/08/12 1710 12/09/12 0400  Weight: 71 kg (156 lb 8.4 oz) 72 kg (158 lb 11.7 oz)    Exam:   General exam: Comfortable.  Respiratory system: Clear. No increased work of breathing.  Cardiovascular system: S1 & S2 heard, RRR. No JVD, murmurs, gallops, clicks. Trace bilateral leg edema. Telemetry: Sinus rhythm.  Gastrointestinal system: Abdomen is nondistended, soft and nontender. Normal bowel sounds heard.  Central nervous system: Alert and oriented. No focal neurological deficits.  Extremities:  Symmetric 5 x 5 power. No asymmetric leg swellings. Peripheral pulses symmetrically felt.   Data Reviewed: Basic Metabolic Panel:  Recent Labs Lab 12/08/12 1400 12/09/12 0420 12/10/12 0345  NA 128* 131*  132*  K 4.5 4.0 3.9  CL 92* 96 97  CO2 26 25 24   GLUCOSE 112* 109* 147*  BUN 10 7 5*  CREATININE 0.54 0.50 0.43*  CALCIUM 8.8 8.3* 8.6   Liver Function Tests: No results found for this basename: AST, ALT, ALKPHOS, BILITOT, PROT, ALBUMIN,  in the last 168 hours No results found for this basename: LIPASE, AMYLASE,  in the last 168 hours No results found for this basename: AMMONIA,  in the last 168 hours CBC:  Recent Labs Lab 12/08/12 1400 12/09/12 0420 12/10/12 0345  WBC 9.2 8.6 10.1  NEUTROABS 6.9  --   --   HGB 7.7* 7.3* 9.9*  HCT 24.7* 22.2* 30.7*  MCV 91.8 92.1 90.0  PLT 314 310 318   Cardiac Enzymes:  Recent Labs Lab 12/08/12 1400  TROPONINI <0.30   BNP (last 3 results) No results found for this basename: PROBNP,  in the last 8760 hours CBG:  Recent Labs Lab 12/08/12 1000  GLUCAP 133*    Recent Results (from the past 240 hour(s))  MRSA PCR SCREENING     Status: None   Collection Time    12/08/12  5:23 PM      Result Value Range Status   MRSA by PCR NEGATIVE  NEGATIVE Final   Comment:            The GeneXpert MRSA Assay (FDA     approved for NASAL specimens     only), is one component of a     comprehensive MRSA colonization     surveillance program. It is not     intended to diagnose MRSA     infection nor to guide or     monitor treatment for     MRSA infections.     Studies: No results found. VASCULAR LAB  PRELIMINARY PRELIMINARY PRELIMINARY PRELIMINARY   Bilateral lower extremity venous duplex completed.   Preliminary report: Right: DVT noted in the distal CFV through the PTV and peroneal veins. No evidence of superficial thrombosis. No Baker's cyst. Left: DVT noted in the CFV through the PTV and peroneal veins and including the gasgrocnemius veins. No evidence of superficial thrombosis. No Baker's cyst.    Additional labs:   Scheduled Meds: . antiseptic oral rinse  15 mL Mouth Rinse BID  . feeding supplement  1 Container Oral TID BM   . multivitamin with minerals  1 tablet Oral Daily  . pantoprazole  40 mg Oral Daily  . potassium chloride SA  20 mEq Oral Daily  . pramipexole  1 mg Oral BID AC  . pramipexole  1.5 mg Oral QHS  . pyridostigmine  60 mg Oral TID   Continuous Infusions: . heparin 1,600 Units/hr (12/10/12 0137)    Active Problems:   Metastatic lung carcinoma   Parkinson disease   Pulmonary embolism   DVT, bilateral lower limbs   Anemia   Protein-calorie malnutrition, severe    Time spent: 25 minutes.    [facility]  [facility] Pager [phone].   If 8PM-8AM, please contact night-coverage at [email or web address], password [facility] 12/10/2012, 12:46 PM  LOS: 2 days

## 2012-12-10 NOTE — Progress Notes (Signed)
ANTICOAGULATION CONSULT NOTE - Follow Up Consult  Pharmacy Consult for heparin Indication: pulmonary embolus  No Known Allergies  Patient Measurements: Height: 5\' 3"  (160 cm) Weight: 158 lb 11.7 oz (72 kg) IBW/kg (Calculated) : 52.4 Heparin Dosing Weight:   Vital Signs: Temp: 99.4 F (37.4 C) (06/26 0400) Temp src: Oral (06/26 0400) BP: 96/58 mmHg (06/26 0400) Pulse Rate: 95 (06/26 0400)  Labs:  Recent Labs  12/08/12 1400  12/09/12 0420 12/09/12 0820 12/09/12 2040 12/10/12 0345  HGB 7.7*  --  7.3*  --   --  9.9*  HCT 24.7*  --  22.2*  --   --  30.7*  PLT 314  --  310  --   --  318  APTT 30  --   --   --   --   --   LABPROT 15.2  --   --   --   --   --   INR 1.22  --   --   --   --   --   HEPARINUNFRC  --   < >  --  0.35 0.17* 0.52  CREATININE 0.54  --  0.50  --   --  0.43*  TROPONINI <0.30  --   --   --   --   --   < > = values in this interval not displayed.  Estimated Creatinine Clearance: 66.6 ml/min (by C-G formula based on Cr of 0.43).   Medications:  Infusions:  . heparin 1,600 Units/hr (12/10/12 0137)    Assessment: Patient with heparin level at goal.  No issues per RN.  Goal of Therapy:  Heparin level 0.3-0.7 units/ml Monitor platelets by anticoagulation protocol: Yes   Plan:  Heparin at same rate. Recheck level at 1300 today.  Darlina Guys, Jacquenette Shone Crowford 12/10/2012,6:17 AM

## 2012-12-10 NOTE — Progress Notes (Signed)
I had a long talk with [name] Pacheco this morning. She has decided not to take any further chemotherapy. I totally understand this and respect his decision. She had a hard time with first line chemotherapy and I think she would have a tough time with any additional chemotherapy with a less than 20-25% chance of responding. She wants to focus on quality of life which I feel is the right way to go.  I will get hospice involved. She agrees with this.   She continues on heparin. There is no leg pain. She still has some right upper quadrant pain. This may be from a pulmonary embolism. It could also be from hepatic metastasis. We will be very aggressive with pain control. I may consider putting a fentanyl patch on.  We will see what her pre-albumin is. This will be prognostic.  There is no bleeding. She did well with 2 units of blood yesterday. Her hemoglobin today is 9.1.  I appreciate pharmacies help with heparin dosing. Keep her on heparin for one more day and then switch her over to Lovenox. She'll need to be on Lovenox as an outpatient. I will try to dose her with 1.5 mg per kilogram daily dosing.  Her vital signs are okay. Blood pressures in the low 96/58. Heart rate is 92 and regular.  There is no real change in her physical exam. Lungs still sound pretty clear. Cardiac exam regular rate and rhythm with no murmurs rubs or bruits. Abdomen soft. There is no hepatomegaly. There is some slight tenderness in the right upper quadrant. Extremities shows some trace edema in her legs. Neurological exam is nonfocal.  Her electrolytes are okay. Calcium 8.6. Potassium 3.9.  At this point, we will focus on quality of life issues. Again, I will get hospice involved.  She is at ease with her decision. I spent quite a while with her husband and son last night. I believe they understand what is the prognosis. And they will agree with whatever [name] Pacheco has decided.  Harvie Heck 2:32

## 2012-12-11 LAB — CBC
HCT: 31.8 % — ABNORMAL LOW (ref 36.0–46.0)
Hemoglobin: 10.2 g/dL — ABNORMAL LOW (ref 12.0–15.0)
MCHC: 32.1 g/dL (ref 30.0–36.0)
MCV: 92.7 fL (ref 78.0–100.0)
WBC: 10 10*3/uL (ref 4.0–10.5)

## 2012-12-11 MED ORDER — FENTANYL 12 MCG/HR TD PT72
12.5000 ug | MEDICATED_PATCH | TRANSDERMAL | Status: DC
  Administered 2012-12-11: 12.5 ug via TRANSDERMAL
  Filled 2012-12-11: qty 1

## 2012-12-11 MED ORDER — HYDROMORPHONE HCL PF 1 MG/ML IJ SOLN: 1.0000 mg | INTRAMUSCULAR | Status: DC | PRN

## 2012-12-11 MED ORDER — DRONABINOL 2.5 MG PO CAPS
2.5000 mg | ORAL_CAPSULE | Freq: Two times a day (BID) | ORAL | Status: DC
  Administered 2012-12-11 (×2): 2.5 mg via ORAL
  Filled 2012-12-11 (×2): qty 1

## 2012-12-11 MED ORDER — OXYCODONE HCL 5 MG PO TABS
5.0000 mg | ORAL_TABLET | ORAL | Status: DC | PRN
  Administered 2012-12-11 (×2): 5 mg via ORAL
  Administered 2012-12-12: 10 mg via ORAL
  Administered 2012-12-12: 5 mg via ORAL
  Filled 2012-12-11: qty 2
  Filled 2012-12-11 (×3): qty 1

## 2012-12-11 MED ORDER — ENOXAPARIN SODIUM 120 MG/0.8ML ~~LOC~~ SOLN
110.0000 mg | SUBCUTANEOUS | Status: DC
  Administered 2012-12-11 – 2012-12-14 (×4): 110 mg via SUBCUTANEOUS
  Filled 2012-12-11 (×5): qty 0.8

## 2012-12-11 NOTE — Progress Notes (Signed)
[facility] PROGRESS NOTE  [name] Pacheco ZOX:[id number] DOB: Dec 08, 1945 DOA: 12/08/2012 PCP: Josph Macho, MD  Brief narrative 67 y.o. female Metastatic lung cancer-adenocarcinoma (wild type 4 EGFR/ALK/ROS), s/p XRT and 2 cycles of chemotherapy went for a staging Ct and PEt scan that showed showed Large Central PE with emboli in both main pulmonary arteries and bilateral lower extremity DVT on venous Doppler. She also has h/o of neuropathy and parkinson like symptoms. She is just s/p her second chemo cycle and reports progressive weakness and nausea. In addition she also reports dyspnea with exertion, intermittent pain in hers legs and pleuritic pain in both lower chest for past few days.    Assessment/Plan: 1. Large Central PE & bilateral lower extremity DVT: started on IV heparin. Oncology managing anticoagulation and will switch to once daily Lovenox on 6/27. Asymptomatic & hemodynamically stable. Not sure if Avastin played a role in the TEE. Low-grade fevers may be secondary to VTE. 2. Stage 4 adenoCa of lung: Oncology consultation appreciated. She is status post 2 cycles of chemotherapy and has progressed on recent imaging. 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 As per oncology discussion with the patient and family-patient has decided not to take any further chemotherapy. Discussed with oncology who recommended no further need for MRI brain- Canceled. Oncology will arrange for hospice. 3. Anemia: likely from chronic disease, cancer and chemotherapy. Status post 2 units PRBCs on 6/25-improved. 4. Hypertension: Controlled. Holding diuretics for now. Intermittent soft blood pressures-asymptomatic.? Secondary to pain medications. Not on any antihypertensives currently. Monitor. 5. Parkinson's disease: Continue Pramipexole and Pyridostigmine.  6. Pain from 2/mets: Pain controlled on Dilaudid IV when necessary. Fentanyl patch added. We'll resume  by mouth oxycodone. 7. Hyponatremia: Patient appears euvolemic or even a little volume overloaded. This may be secondary to HCTZ or SIADH. HCTZ currently on hold. Status post Lasix x1 on 6/25. Sodium stable.   Code Status: DO NOT RESUSCITATE Family Communication: Discussed with spouse at the bedside. Disposition Plan: Primary oncologist recommends monitoring patient in the hospital over the weekend.   Consultants:  Medical oncology  Procedures:  None  Antibiotics:  None   HPI/Subjective: Right lower chest pain better controlled. Poor appetite  Objective: Filed Vitals:   12/10/12 1939 12/10/12 2042 12/11/12 0440 12/11/12 1423  BP: 109/65 106/67 109/72 102/68  Pulse: 95 94 93 104  Temp: 97.7 F (36.5 C) 97.9 F (36.6 C) 98.8 F (37.1 C) 98.3 F (36.8 C)  TempSrc: Oral Oral Oral Oral  Resp: 18 18 18 16   Height: 5\' 4"  (1.626 m)     Weight: 71.305 kg (157 lb 3.2 oz)     SpO2: 95% 95% 96% 97%    Intake/Output Summary (Last 24 hours) at 12/11/12 1637 Last data filed at 12/11/12 0446  Gross per 24 hour  Intake    403 ml  Output      0 ml  Net    403 ml   Filed Weights   12/08/12 1710 12/09/12 0400 12/10/12 1939  Weight: 71 kg (156 lb 8.4 oz) 72 kg (158 lb 11.7 oz) 71.305 kg (157 lb 3.2 oz)    Exam:   General exam: Comfortable.  Respiratory system: Clear. No increased work of breathing.  Cardiovascular system: S1 & S2 heard, RRR. No JVD, murmurs, gallops, clicks. Trace bilateral leg edema.   Gastrointestinal system: Abdomen is nondistended, soft and nontender. Normal bowel sounds heard.  Central nervous system: Alert and oriented. No focal neurological deficits.  Extremities: Symmetric 5 x 5 power. No asymmetric leg swellings. Peripheral pulses symmetrically felt.   Data Reviewed: Basic Metabolic Panel:  Recent Labs Lab 12/08/12 1400 12/09/12 0420 12/10/12 0345  NA 128* 131* 132*  K 4.5 4.0 3.9  CL 92* 96 97  CO2 26 25 24   GLUCOSE 112* 109* 147*   BUN 10 7 5*  CREATININE 0.54 0.50 0.43*  CALCIUM 8.8 8.3* 8.6   Liver Function Tests: No results found for this basename: AST, ALT, ALKPHOS, BILITOT, PROT, ALBUMIN,  in the last 168 hours No results found for this basename: LIPASE, AMYLASE,  in the last 168 hours No results found for this basename: AMMONIA,  in the last 168 hours CBC:  Recent Labs Lab 12/08/12 1400 12/09/12 0420 12/10/12 0345 12/11/12 0440  WBC 9.2 8.6 10.1 10.0  NEUTROABS 6.9  --   --   --   HGB 7.7* 7.3* 9.9* 10.2*  HCT 24.7* 22.2* 30.7* 31.8*  MCV 91.8 92.1 90.0 92.7  PLT 314 310 318 352   Cardiac Enzymes:  Recent Labs Lab 12/08/12 1400  TROPONINI <0.30   BNP (last 3 results) No results found for this basename: PROBNP,  in the last 8760 hours CBG:  Recent Labs Lab 12/08/12 1000  GLUCAP 133*    Recent Results (from the past 240 hour(s))  MRSA PCR SCREENING     Status: None   Collection Time    12/08/12  5:23 PM      Result Value Range Status   MRSA by PCR NEGATIVE  NEGATIVE Final   Comment:            The GeneXpert MRSA Assay (FDA     approved for NASAL specimens     only), is one component of a     comprehensive MRSA colonization     surveillance program. It is not     intended to diagnose MRSA     infection nor to guide or     monitor treatment for     MRSA infections.     Studies: No results found. VASCULAR LAB  PRELIMINARY PRELIMINARY PRELIMINARY PRELIMINARY   Bilateral lower extremity venous duplex completed.   Preliminary report: Right: DVT noted in the distal CFV through the PTV and peroneal veins. No evidence of superficial thrombosis. No Baker's cyst. Left: DVT noted in the CFV through the PTV and peroneal veins and including the gasgrocnemius veins. No evidence of superficial thrombosis. No Baker's cyst.    Additional labs:   Scheduled Meds: . antiseptic oral rinse  15 mL Mouth Rinse BID  . dronabinol  2.5 mg Oral BID AC  . enoxaparin (LOVENOX) injection  110 mg  Subcutaneous Q24H  . feeding supplement  1 Container Oral TID BM  . fentaNYL  12.5 mcg Transdermal Q72H  . multivitamin with minerals  1 tablet Oral Daily  . pantoprazole  40 mg Oral Daily  . potassium chloride SA  20 mEq Oral Daily  . pramipexole  1 mg Oral BID AC  . pramipexole  1.5 mg Oral QHS  . pyridostigmine  60 mg Oral TID   Continuous Infusions:    Active Problems:   Metastatic lung carcinoma   Parkinson disease   Pulmonary embolism   DVT, bilateral lower limbs   Anemia   Protein-calorie malnutrition, severe    Time spent: 25 minutes.    [facility]  [facility] Pager [phone].   If 8PM-8AM, please contact night-coverage at [email or web address], password [facility] 12/11/2012, 4:37 PM  LOS: 3 days

## 2012-12-11 NOTE — Progress Notes (Signed)
[name] Pacheco is still feeling weak. She continues on a heparin drip. She's not complaining of any leg pain. The still some mild swelling in the legs. She did walk to the bathroom.  She still has pain in the right upper quadrant. I am going to start her on if it no patch at 12.5 mcg.  Her appetite is still poor. I will start her on some Marinol at 2.5 mg by mouth twice a day. I would like to try to avoid prednisone on her.  Her prealbumin is only 6.4. I think this is highly indicative of her prognosis. I fully agree with her not taking any further chemotherapy. She wants to focus on her quality of life. We have sent information to hospice referral for when she is discharged.  I really believe that she needs to stay over the weekend so we can see how to see how her appetite does with the Marinol and see how the fentanyl patch helps.  It would be nice to try to cut out some of her medications.  On her physical exam, vital signs are pretty good. Blood pressure 109/72. Heart rate 93. Oxygen saturation is 96%. Her lungs sound clear. Cardiac exam regular rate and rhythm with no murmurs rubs or bruits. Abdomen is soft. There is no palpable hepatomegaly. Extremities do show some trace edema in her legs. No obvious venous cord is appreciated. Neurological exam shows no focal neurological deficits.  I spoke to [name] Pacheco and her family this morning. Everybody is on the "same page" with are focus of care for her. She wants quality of life. I told the that I felt that she made it to Labor Day that that would be optimistic. Again, her prealbumin being so low is indicative of the fact that she has very little reserve left and that any stress on her body will cause her to declined quickly.  Again, I really believe that given her through the weekend would be best for her and her family so that we can try to improve her functional status a little bit so that she will feel better when she goes home.  Pete E.  Hebrews  4:16

## 2012-12-11 NOTE — Progress Notes (Addendum)
ANTICOAGULATION CONSULT NOTE - Follow Up Consult  Pharmacy Consult for heparin Indication: pulmonary embolus  No Known Allergies  Patient Measurements: Height: 5\' 4"  (162.6 cm) Weight: 157 lb 3.2 oz (71.305 kg) IBW/kg (Calculated) : 54.7 Heparin Dosing Weight:   Vital Signs: Temp: 98.8 F (37.1 C) (06/27 0440) Temp src: Oral (06/27 0440) BP: 109/72 mmHg (06/27 0440) Pulse Rate: 93 (06/27 0440)  Labs:  Recent Labs  12/08/12 1400  12/09/12 0420  12/10/12 0345 12/10/12 1305 12/11/12 0440  HGB 7.7*  --  7.3*  --  9.9*  --  10.2*  HCT 24.7*  --  22.2*  --  30.7*  --  31.8*  PLT 314  --  310  --  318  --  352  APTT 30  --   --   --   --   --   --   LABPROT 15.2  --   --   --   --   --   --   INR 1.22  --   --   --   --   --   --   HEPARINUNFRC  --   < >  --   < > 0.52 0.47 0.44  CREATININE 0.54  --  0.50  --  0.43*  --   --   TROPONINI <0.30  --   --   --   --   --   --   < > = values in this interval not displayed.  Estimated Creatinine Clearance: 67.8 ml/min (by C-G formula based on Cr of 0.43).   Medications:  Infusions:  . heparin 1,600 Units/hr (12/11/12 1610)    Assessment: 66 yof with lung cancer, admitted 6/24 with SOB. Found to have large PE and bilateral DVTs.  IV heparin started 6/24  Heparin level still therapeutic today on 1600 units/hr  H/H improved after transfusion, Pltc stable, no bleeding reported  Goal of Therapy:  Heparin level 0.3-0.7 units/ml Monitor platelets by anticoagulation protocol: Yes   Plan:   Continue heparin 1600 units/hr  Heparin level and CBC in am  Gwen Her PharmD  [phone] 12/11/2012 7:48 AM    Addendum:  Sherron Monday with Dr Myna Hidalgo who would like to convert heparin to SQ lovenox today  Scr 0.43, CrCl 68 ml/min Wt 71.3kg H/H and pltc ok  Plan: Start Lovenox 110mg  sq q24h (1.5mg /kg)  Gwen Her PharmD  [phone] 12/11/2012 7:52 AM

## 2012-12-12 LAB — CBC
MCV: 92.3 fL (ref 78.0–100.0)
Platelets: 333 10*3/uL (ref 150–400)
RBC: 3.37 MIL/uL — ABNORMAL LOW (ref 3.87–5.11)
RDW: 19.5 % — ABNORMAL HIGH (ref 11.5–15.5)
WBC: 9.4 10*3/uL (ref 4.0–10.5)

## 2012-12-12 MED ORDER — HYDROMORPHONE HCL 2 MG PO TABS
2.0000 mg | ORAL_TABLET | ORAL | Status: DC | PRN
  Administered 2012-12-12 – 2012-12-14 (×8): 2 mg via ORAL
  Filled 2012-12-12 (×8): qty 1

## 2012-12-12 MED ORDER — DRONABINOL 2.5 MG PO CAPS
2.5000 mg | ORAL_CAPSULE | Freq: Three times a day (TID) | ORAL | Status: DC
  Administered 2012-12-12 – 2012-12-14 (×9): 2.5 mg via ORAL
  Filled 2012-12-12 (×9): qty 1

## 2012-12-12 NOTE — Progress Notes (Signed)
[facility] PROGRESS NOTE  [name] Pacheco:[id number] DOB: 27-Jul-1945 DOA: 12/08/2012 PCP: Josph Macho, MD  Brief narrative 67 y.o. female Metastatic lung cancer-adenocarcinoma (wild type 4 EGFR/ALK/ROS), s/p XRT and 2 cycles of chemotherapy went for a staging Ct and PEt scan that showed showed Large Central PE with emboli in both main pulmonary arteries and bilateral lower extremity DVT on venous Doppler. She also has h/o of neuropathy and parkinson like symptoms. She is just s/p her second chemo cycle and reports progressive weakness and nausea. In addition she also reports dyspnea with exertion, intermittent pain in hers legs and pleuritic pain in both lower chest for past few days.    Assessment/Plan: 1. Large Central PE & bilateral lower extremity DVT: started on IV heparin. Oncology managing anticoagulation and will switch to once daily Lovenox on 6/27. Asymptomatic & hemodynamically stable. Not sure if Avastin played a role in the TEE. Low-grade fevers may be secondary to VTE. 2. Stage 4 adenoCa of lung: Oncology consultation appreciated. She is status post 2 cycles of chemotherapy and has progressed on recent imaging. 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 As per oncology discussion with the patient and family-patient has decided not to take any further chemotherapy. Discussed with oncology who recommended no further need for MRI brain- Canceled. Oncology will arrange for hospice. 3. Anemia: likely from chronic disease, cancer and chemotherapy. Status post 2 units PRBCs on 6/25-improved. 4. Hypertension: Controlled. Holding diuretics for now. Intermittent soft blood pressures-asymptomatic.? Secondary to pain medications. Not on any antihypertensives currently. Monitor. 5. Parkinson's disease: Continue Pramipexole and Pyridostigmine.  6. Pain from 2/mets: Pain controlled on Dilaudid IV when necessary. Fentanyl patch added. Oncology has  changed oral oxycodone to Dilaudid. 7. Hyponatremia: Patient appears euvolemic or even a little volume overloaded. This may be secondary to HCTZ or SIADH. HCTZ currently on hold. Status post Lasix x1 on 6/25. Sodium stable.   Code Status: DO NOT RESUSCITATE Family Communication: Discussed with extended family at bedside. Disposition Plan: Primary oncologist recommends monitoring patient in the hospital over the weekend.   Consultants:  Medical oncology  Procedures:  None  Antibiotics:  None   HPI/Subjective: Patient feels much better. Right-sided chest pain controlled. Appetite better yesterday.  Objective: Filed Vitals:   12/11/12 1423 12/11/12 2044 12/12/12 0601 12/12/12 1402  BP: 102/68 118/74 129/79 93/65  Pulse: 104 107 98 101  Temp: 98.3 F (36.8 C) 99.1 F (37.3 C) 98.1 F (36.7 C) 97.9 F (36.6 C)  TempSrc: Oral Oral Oral Oral  Resp: 16 16 16 16   Height:      Weight:      SpO2: 97% 96% 97% 97%    Intake/Output Summary (Last 24 hours) at 12/12/12 1655 Last data filed at 12/12/12 1300  Gross per 24 hour  Intake    600 ml  Output      0 ml  Net    600 ml   Filed Weights   12/08/12 1710 12/09/12 0400 12/10/12 1939  Weight: 71 kg (156 lb 8.4 oz) 72 kg (158 lb 11.7 oz) 71.305 kg (157 lb 3.2 oz)    Exam:   General exam: Comfortable. In good spirits. Family at bedside.  Respiratory system: Clear. No increased work of breathing.  Cardiovascular system: S1 & S2 heard, RRR. No JVD, murmurs, gallops, clicks. Trace bilateral leg edema.   Gastrointestinal system: Abdomen is nondistended, soft and nontender. Normal bowel sounds heard.  Central nervous system: Alert and oriented. No  focal neurological deficits.  Extremities: Symmetric 5 x 5 power. No asymmetric leg swellings. Peripheral pulses symmetrically felt.   Data Reviewed: Basic Metabolic Panel:  Recent Labs Lab 12/08/12 1400 12/09/12 0420 12/10/12 0345  NA 128* 131* 132*  K 4.5 4.0 3.9   CL 92* 96 97  CO2 26 25 24   GLUCOSE 112* 109* 147*  BUN 10 7 5*  CREATININE 0.54 0.50 0.43*  CALCIUM 8.8 8.3* 8.6   Liver Function Tests: No results found for this basename: AST, ALT, ALKPHOS, BILITOT, PROT, ALBUMIN,  in the last 168 hours No results found for this basename: LIPASE, AMYLASE,  in the last 168 hours No results found for this basename: AMMONIA,  in the last 168 hours CBC:  Recent Labs Lab 12/08/12 1400 12/09/12 0420 12/10/12 0345 12/11/12 0440 12/12/12 0500  WBC 9.2 8.6 10.1 10.0 9.4  NEUTROABS 6.9  --   --   --   --   HGB 7.7* 7.3* 9.9* 10.2* 9.7*  HCT 24.7* 22.2* 30.7* 31.8* 31.1*  MCV 91.8 92.1 90.0 92.7 92.3  PLT 314 310 318 352 333   Cardiac Enzymes:  Recent Labs Lab 12/08/12 1400  TROPONINI <0.30   BNP (last 3 results) No results found for this basename: PROBNP,  in the last 8760 hours CBG:  Recent Labs Lab 12/08/12 1000  GLUCAP 133*    Recent Results (from the past 240 hour(s))  MRSA PCR SCREENING     Status: None   Collection Time    12/08/12  5:23 PM      Result Value Range Status   MRSA by PCR NEGATIVE  NEGATIVE Final   Comment:            The GeneXpert MRSA Assay (FDA     approved for NASAL specimens     only), is one component of a     comprehensive MRSA colonization     surveillance program. It is not     intended to diagnose MRSA     infection nor to guide or     monitor treatment for     MRSA infections.     Studies: No results found. VASCULAR LAB  PRELIMINARY PRELIMINARY PRELIMINARY PRELIMINARY   Bilateral lower extremity venous duplex completed.   Preliminary report: Right: DVT noted in the distal CFV through the PTV and peroneal veins. No evidence of superficial thrombosis. No Baker's cyst. Left: DVT noted in the CFV through the PTV and peroneal veins and including the gasgrocnemius veins. No evidence of superficial thrombosis. No Baker's cyst.    Additional labs:   Scheduled Meds: . antiseptic oral rinse   15 mL Mouth Rinse BID  . dronabinol  2.5 mg Oral TID AC  . enoxaparin (LOVENOX) injection  110 mg Subcutaneous Q24H  . feeding supplement  1 Container Oral TID BM  . fentaNYL  12.5 mcg Transdermal Q72H  . multivitamin with minerals  1 tablet Oral Daily  . pantoprazole  40 mg Oral Daily  . potassium chloride SA  20 mEq Oral Daily  . pramipexole  1 mg Oral BID AC  . pramipexole  1.5 mg Oral QHS  . pyridostigmine  60 mg Oral TID   Continuous Infusions:    Active Problems:   Metastatic lung carcinoma   Parkinson disease   Pulmonary embolism   DVT, bilateral lower limbs   Anemia   Protein-calorie malnutrition, severe    Time spent: 25 minutes.    [facility]  [facility] Pager [phone].  If 8PM-8AM, please contact night-coverage at [email or web address], password [facility] 12/12/2012, 4:55 PM  LOS: 4 days

## 2012-12-12 NOTE — Progress Notes (Signed)
Mrs. Wolford actually looks a little better. She now is on Lovenox. She wants to try to shower. I do not see a problem with this.  She's not as much change with the Duragesic patch. He does put this on yesterday. I am going to change her oxycodone and start her on oral Dilaudid.  Her appetite might be a little better she feels. We started her on Marinol. I will increase this to 3 times a day.  She's had a little cough this morning but this is not productive. There is no bleeding.  Her vital signs are all stable. Lungs are clear. There is no mucositis. Abdomen is soft. Cardiac exam regular rate and rhythm. Extremities show some trace edema.   Her CBC looks okay today. Hemoglobin is 9.7.  I feel that she likely will be able to go home by Monday if all goes well this weekend. I want to make sure that she and her family are comfortable with the Lovenox injections. I want to make sure she has good pain control.  Again, I thank the hospitalist for the wonderful care that they are giving her.  Cindee Lame

## 2012-12-13 LAB — CBC
HCT: 31.1 % — ABNORMAL LOW (ref 36.0–46.0)
Hemoglobin: 9.6 g/dL — ABNORMAL LOW (ref 12.0–15.0)
MCHC: 30.9 g/dL (ref 30.0–36.0)
RDW: 19.4 % — ABNORMAL HIGH (ref 11.5–15.5)
WBC: 9.1 10*3/uL (ref 4.0–10.5)

## 2012-12-13 NOTE — Progress Notes (Signed)
[facility] PROGRESS NOTE  [name] Pacheco NWG:[id number] DOB: 1945-11-25 DOA: 12/08/2012 PCP: Josph Macho, MD  Brief narrative 67 y.o. female Metastatic lung cancer-adenocarcinoma (wild type 4 EGFR/ALK/ROS), s/p XRT and 2 cycles of chemotherapy went for a staging Ct and PEt scan that showed showed Large Central PE with emboli in both main pulmonary arteries and bilateral lower extremity DVT on venous Doppler. She also has h/o of neuropathy and parkinson like symptoms. She is just s/p her second chemo cycle and reports progressive weakness and nausea. In addition she also reports dyspnea with exertion, intermittent pain in hers legs and pleuritic pain in both lower chest for past few days.    Assessment/Plan: 1. Large Central PE & bilateral lower extremity DVT: started on IV heparin. Oncology managing anticoagulation and will switch to once daily Lovenox on 6/27. Asymptomatic & hemodynamically stable. Not sure if Avastin played a role in the TEE. Low-grade fevers may be secondary to VTE. 2. Stage 4 adenoCa of lung: Oncology consultation appreciated. She is status post 2 cycles of chemotherapy and has progressed on recent imaging. 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 As per oncology discussion with the patient and family-patient has decided not to take any further chemotherapy. Discussed with oncology who recommended no further need for MRI brain- Canceled. Oncology will arrange for hospice. 3. Anemia: likely from chronic disease, cancer and chemotherapy. Status post 2 units PRBCs on 6/25-improved. 4. Hypertension: Controlled. Holding diuretics for now. Intermittent soft blood pressures-asymptomatic.? Secondary to pain medications. Not on any antihypertensives currently. Monitor. 5. Parkinson's disease: Continue Pramipexole and Pyridostigmine.  6. Pain from 2/mets: Pain controlled on Dilaudid IV when necessary. Fentanyl patch added. Oncology has  changed oral oxycodone to Dilaudid. Pain better controlled. 7. Hyponatremia: Patient appears euvolemic or even a little volume overloaded. This may be secondary to HCTZ or SIADH. HCTZ currently on hold. Status post Lasix x1 on 6/25. Sodium stable.   Code Status: DO NOT RESUSCITATE Family Communication: Discussed with extended family at bedside. Disposition Plan: Primary oncologist recommends monitoring patient in the hospital over the weekend. Possible DC 6/30   Consultants:  Medical oncology  Procedures:  None  Antibiotics:  None   HPI/Subjective: Ambulated yesterday- had some LLE pain that resolved after rest. Nausea after coughing spells (dry). Overall feels better. Some redness at old sternal Bx site.  Objective: Filed Vitals:   12/12/12 0601 12/12/12 1402 12/12/12 2100 12/13/12 0704  BP: 129/79 93/65 116/72 107/75  Pulse: 98 101 100 84  Temp: 98.1 F (36.7 C) 97.9 F (36.6 C) 98.5 F (36.9 C) 98.2 F (36.8 C)  TempSrc: Oral Oral Axillary Oral  Resp: 16 16 18 18   Height:      Weight:      SpO2: 97% 97% 96% 96%    Intake/Output Summary (Last 24 hours) at 12/13/12 1245 Last data filed at 12/13/12 0900  Gross per 24 hour  Intake    240 ml  Output      0 ml  Net    240 ml   Filed Weights   12/08/12 1710 12/09/12 0400 12/10/12 1939  Weight: 71 kg (156 lb 8.4 oz) 72 kg (158 lb 11.7 oz) 71.305 kg (157 lb 3.2 oz)    Exam:   General exam: Comfortable. In good spirits. Family at bedside- playing game of cards.  Respiratory system: Clear. No increased work of breathing. Tiny spot of minimal redness, induration but no pointing or fluctuance at manubrium sterni.  Cardiovascular system: S1 & S2 heard, RRR. No JVD, murmurs, gallops, clicks. Trace bilateral leg edema.   Gastrointestinal system: Abdomen is nondistended, soft and nontender. Normal bowel sounds heard.  Central nervous system: Alert and oriented. No focal neurological deficits.  Extremities:  Symmetric 5 x 5 power. No asymmetric leg swellings. Peripheral pulses symmetrically felt. Healing scab under left knee.    Data Reviewed: Basic Metabolic Panel:  Recent Labs Lab 12/08/12 1400 12/09/12 0420 12/10/12 0345  NA 128* 131* 132*  K 4.5 4.0 3.9  CL 92* 96 97  CO2 26 25 24   GLUCOSE 112* 109* 147*  BUN 10 7 5*  CREATININE 0.54 0.50 0.43*  CALCIUM 8.8 8.3* 8.6   Liver Function Tests: No results found for this basename: AST, ALT, ALKPHOS, BILITOT, PROT, ALBUMIN,  in the last 168 hours No results found for this basename: LIPASE, AMYLASE,  in the last 168 hours No results found for this basename: AMMONIA,  in the last 168 hours CBC:  Recent Labs Lab 12/08/12 1400 12/09/12 0420 12/10/12 0345 12/11/12 0440 12/12/12 0500 12/13/12 0515  WBC 9.2 8.6 10.1 10.0 9.4 9.1  NEUTROABS 6.9  --   --   --   --   --   HGB 7.7* 7.3* 9.9* 10.2* 9.7* 9.6*  HCT 24.7* 22.2* 30.7* 31.8* 31.1* 31.1*  MCV 91.8 92.1 90.0 92.7 92.3 93.1  PLT 314 310 318 352 333 331   Cardiac Enzymes:  Recent Labs Lab 12/08/12 1400  TROPONINI <0.30   BNP (last 3 results) No results found for this basename: PROBNP,  in the last 8760 hours CBG:  Recent Labs Lab 12/08/12 1000  GLUCAP 133*    Recent Results (from the past 240 hour(s))  MRSA PCR SCREENING     Status: None   Collection Time    12/08/12  5:23 PM      Result Value Range Status   MRSA by PCR NEGATIVE  NEGATIVE Final   Comment:            The GeneXpert MRSA Assay (FDA     approved for NASAL specimens     only), is one component of a     comprehensive MRSA colonization     surveillance program. It is not     intended to diagnose MRSA     infection nor to guide or     monitor treatment for     MRSA infections.     Studies: No results found. VASCULAR LAB  PRELIMINARY PRELIMINARY PRELIMINARY PRELIMINARY   Bilateral lower extremity venous duplex completed.   Preliminary report: Right: DVT noted in the distal CFV through  the PTV and peroneal veins. No evidence of superficial thrombosis. No Baker's cyst. Left: DVT noted in the CFV through the PTV and peroneal veins and including the gasgrocnemius veins. No evidence of superficial thrombosis. No Baker's cyst.    Additional labs:   Scheduled Meds: . antiseptic oral rinse  15 mL Mouth Rinse BID  . dronabinol  2.5 mg Oral TID AC  . enoxaparin (LOVENOX) injection  110 mg Subcutaneous Q24H  . feeding supplement  1 Container Oral TID BM  . fentaNYL  12.5 mcg Transdermal Q72H  . multivitamin with minerals  1 tablet Oral Daily  . pantoprazole  40 mg Oral Daily  . potassium chloride SA  20 mEq Oral Daily  . pramipexole  1 mg Oral BID AC  . pramipexole  1.5 mg Oral QHS  . pyridostigmine  60 mg  Oral TID   Continuous Infusions:    Active Problems:   Metastatic lung carcinoma   Parkinson disease   Pulmonary embolism   DVT, bilateral lower limbs   Anemia   Protein-calorie malnutrition, severe    Time spent: 20 minutes.    [facility]  [facility] Pager [phone].   If 8PM-8AM, please contact night-coverage at [email or web address], password [facility] 12/13/2012, 12:45 PM  LOS: 5 days

## 2012-12-14 LAB — CBC
Hemoglobin: 9.8 g/dL — ABNORMAL LOW (ref 12.0–15.0)
MCH: 28.7 pg (ref 26.0–34.0)
MCV: 93.8 fL (ref 78.0–100.0)
Platelets: 355 10*3/uL (ref 150–400)
RBC: 3.41 MIL/uL — ABNORMAL LOW (ref 3.87–5.11)
WBC: 10.5 10*3/uL (ref 4.0–10.5)

## 2012-12-14 MED ORDER — FENTANYL 25 MCG/HR TD PT72: 1.0000 | MEDICATED_PATCH | TRANSDERMAL | Status: DC

## 2012-12-14 MED ORDER — FENTANYL 25 MCG/HR TD PT72
25.0000 ug | MEDICATED_PATCH | TRANSDERMAL | Status: DC
  Administered 2012-12-14: 25 ug via TRANSDERMAL
  Filled 2012-12-14: qty 1

## 2012-12-14 MED ORDER — BOOST / RESOURCE BREEZE PO LIQD: 1.0000 | Freq: Three times a day (TID) | ORAL | Status: AC

## 2012-12-14 MED ORDER — ENOXAPARIN SODIUM 120 MG/0.8ML ~~LOC~~ SOLN: 110.0000 mg | SUBCUTANEOUS | Status: AC

## 2012-12-14 MED ORDER — HYDROMORPHONE HCL 2 MG PO TABS: 2.0000 mg | ORAL_TABLET | ORAL | Status: DC | PRN

## 2012-12-14 MED ORDER — DRONABINOL 2.5 MG PO CAPS: 2.5000 mg | ORAL_CAPSULE | Freq: Three times a day (TID) | ORAL | Status: DC

## 2012-12-14 MED ORDER — HEPARIN SOD (PORK) LOCK FLUSH 100 UNIT/ML IV SOLN
INTRAVENOUS | Status: AC
Start: 2012-12-14 — End: 2012-12-14
  Administered 2012-12-14: 500 [IU]
  Filled 2012-12-14: qty 5

## 2012-12-14 MED ORDER — METOCLOPRAMIDE HCL 10 MG PO TABS: 10.0000 mg | ORAL_TABLET | Freq: Four times a day (QID) | ORAL | Status: DC | PRN

## 2012-12-14 MED ORDER — METOCLOPRAMIDE HCL 10 MG PO TABS
10.0000 mg | ORAL_TABLET | Freq: Four times a day (QID) | ORAL | Status: DC | PRN
  Administered 2012-12-14: 10 mg via ORAL
  Filled 2012-12-14: qty 1

## 2012-12-14 NOTE — Progress Notes (Signed)
Discharge instructions and prescriptions given.  No questions, verbalized understanding.  Left via wheelchair with husband. Rubye Oaks

## 2012-12-14 NOTE — Progress Notes (Signed)
[name] Pacheco is doing better. There is still some pain issues. I will increase her Duragesic patch to 25 mcg.  I think we may need to consider radiation therapy to the sternal lesion. I don't believe there is any infection there. I believe this is tumor try to break through the skin.  She really wants to go home. I think she is ready to go home. She is walking more. Her legs do not bother her much. She's had no bleeding with the Lovenox.  Her appetite might be a little bit better. I do have her on some Marinol. I will add a little Reglan for anti-nausea purposes.  We will get hospice to call her today when she gets home.  Her labs are okay.  On physical exam her vital signs are stable. Blood pressure 119/82. Temperature were 98.1. Pulse is 91. Lungs are clear bilaterally. Cardiac exam regular rate and rhythm. Oral exam is without mucositis or thrush. Abdomen soft. Extremities shows some chronic but stable nonpitting edema of the left leg. Has good strength in her extremities. She has good range of motion of her joints. Neurological exam shows no focal neurological deficits.  Family is ready for her to go home also. They're able to do the Lovenox shots. Hospice will be of help out also.  She really has come a long way since she was first admitted. I am glad to see that her quality of life is better.  Fantastic job that was done by the hospitalist and the staff in the step down unit and on 3 E.  Pete E.  Ephesians 5:17

## 2012-12-14 NOTE — Progress Notes (Signed)
ANTICOAGULATION CONSULT NOTE - Follow Up Consult  Pharmacy Consult for Lovenox Indication: pulmonary embolus  No Known Allergies  Patient Measurements: Height: 5\' 4"  (162.6 cm) Weight: 157 lb 3.2 oz (71.305 kg) IBW/kg (Calculated) : 54.7 Heparin Dosing Weight:   Vital Signs: Temp: 98.1 F (36.7 C) (06/30 0538) Temp src: Oral (06/30 0538) BP: 119/82 mmHg (06/30 0538) Pulse Rate: 91 (06/30 0538)  Labs:  Recent Labs  12/12/12 0500 12/13/12 0515 12/14/12 0530  HGB 9.7* 9.6* 9.8*  HCT 31.1* 31.1* 32.0*  PLT 333 331 355    Estimated Creatinine Clearance: 67.8 ml/min (by C-G formula based on Cr of 0.43).    Assessment: 60 yof with lung cancer, admitted 6/24 with SOB. Found to have large PE and bilateral DVTs.  IV heparin started 6/24. Changed to Lovenox full dose 6/27  Scr wnl 6/26, CrCl 68 ml/min  CBC ok, no bleeding reported  Goal of Therapy:  Appropriate Lovenox dose based on wt and CrCl Anti Xa level 1-2 units/ml Monitor platelets by anticoagulation protocol: Yes   Plan:  Continue Lovenox 110mg  sq q24h (1.5mg /kg) Will defer Anti-Xa level given good renal fx and CBC stable  Gwen Her PharmD  [phone] 12/14/2012 11:55 AM

## 2012-12-14 NOTE — Discharge Summary (Signed)
Physician Discharge Summary  [name] Pacheco AVW:[id number] DOB: [birth year] DOA: 12/08/2012  PCP: Josph Macho, MD  Admit date: 12/08/2012 Discharge date: 12/14/2012  Time spent: Greater than 30 minutes  Recommendations for Outpatient Follow-up:  1. Dr. Arlan Organ, Oncology in 1 week 2. [facility] will see patient at home. 3. OP referreal to Radiation Oncology: will be arranged by Dr. Myna Hidalgo.  Discharge Diagnoses:  Active Problems:   Metastatic lung carcinoma   Parkinson disease   Pulmonary embolism   DVT, bilateral lower limbs   Anemia   Protein-calorie malnutrition, severe   Discharge Condition: Improved & Stable  Diet recommendation: Heart Healthy diet.  Filed Weights   12/08/12 1710 12/09/12 0400 12/10/12 1939  Weight: 71 kg (156 lb 8.4 oz) 72 kg (158 lb 11.7 oz) 71.305 kg (157 lb 3.2 oz)    History of present illness:  67 y.o. female Metastatic lung cancer-adenocarcinoma (wild type 4 EGFR/ALK/ROS), s/p XRT and 2 cycles of chemotherapy went for a staging CT and PET scan that showed showed Large Central Pulmonary Embolism and bilateral lower extremity DVT on venous Doppler. She also has h/o of neuropathy and parkinson like symptoms. She is just s/p her second chemo cycle and reports progressive weakness and nausea. In addition she also reports dyspnea with exertion, intermittent pain in hers legs and pleuritic pain in both lower chest for past few days.   Hospital Course:  1. Large Central PE & bilateral lower extremity DVT: Patient was admitted to the step down unit and started on on IV heparin. Oncology consulted and followed course through the hospital course. She was continued on IV heparin for a couple of days and then transitioned to full dose subcutaneous once daily Lovenox. Patient and spouse were educated about self administration of Lovenox.  Patient remained hemodynamically stable. Not sure if Avastin played a role in the TEE.  Low-grade fevers may be secondary to VTE. 2. Stage 4 AdenoCa of lung: Oncology consulted. She is status post 2 cycles of chemotherapy and has progressed on recent imaging. 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 As per oncology discussion with the patient and family-patient decided not to take any further chemotherapy. Oncology will arrange for hospice. Patient seems to have a subcutaneous metastasis on the sternum and oncology will consult radiation oncology as outpatient. Marinol was started for poor appetite. 3. Anemia: likely from chronic disease, cancer and chemotherapy. Status post 2 units PRBCs on 6/25-improved and stable. Outpatient followup by oncology. 4. Hypertension: Controlled. Intermittent soft blood pressures-asymptomatic.? Secondary to pain medications. Not on any antihypertensives currently. Monitor. Continue to hold diuretics and potassium supplements. 5. Parkinson's disease: Continue Pramipexole and Pyridostigmine.  6. Pain from 2/mets: Oncology managed patient's pain-started Duragesic patch and oral Dilaudid with adequate control. Patient has intermittent right upper quadrant abdominal/right lower quadrant chest pain. 7. Hyponatremia: Patient appears euvolemic or even a little volume overloaded. This may be secondary to HCTZ or SIADH. HCTZ currently on hold. Status post Lasix x1 on 6/25. Sodium stable.   Procedures:  None    Consultations:  Oncology  Discharge Exam:  Complaints: No new complaints. Eager to go home.  Filed Vitals:   12/13/12 1315 12/13/12 2135 12/14/12 0538 12/14/12 1341  BP: 96/52 116/69 119/82 107/73  Pulse: 89 102 91 101  Temp: 98.4 F (36.9 C) 98.4 F (36.9 C) 98.1 F (36.7 C) 98.1 F (36.7 C)  TempSrc: Oral Oral Oral Oral  Resp: 18 18 16  16  Height:      Weight:      SpO2: 92% 96% 96% 97%    General exam: Comfortable.   Respiratory system: Clear. No increased work of breathing. Tiny spot of  minimal redness, induration but no pointing or fluctuance at manubrium sterni.  Cardiovascular system: S1 & S2 heard, RRR. No JVD, murmurs, gallops, clicks. Trace bilateral leg edema.  Gastrointestinal system: Abdomen is nondistended, soft and nontender. Normal bowel sounds heard.  Central nervous system: Alert and oriented. No focal neurological deficits.  Extremities: Symmetric 5 x 5 power. No asymmetric leg swellings. Peripheral pulses symmetrically felt. Healing scab under left knee.  Discharge Instructions      Discharge Orders   Future Appointments Provider Department Dept Phone   12/21/2012 9:00 AM [facility] Nurse [facility] [phone]   12/21/2012 9:30 AM Billie Lade, MD [facility] [phone]   12/22/2012 10:15 AM Rachael Fee [facility] [phone]   12/22/2012 10:45 AM Josph Macho, MD [place] [facility] [phone]   12/31/2012 9:30 AM Rachael Fee [place] [facility] [phone]   12/31/2012 10:00 AM Josph Macho, MD [place] [facility] [phone]   12/31/2012 10:45 AM [facility] [address] [place] [facility] [phone]   01/21/2013 9:00 AM Rachael Fee [place] [facility] [phone]   01/21/2013 9:30 AM Josph Macho, MD [place] [facility] [phone]   01/21/2013 10:00 AM [facility] 3 [place] [facility] [phone]   02/11/2013 8:30 AM Sharlotte Alamo [facility] [phone]   02/11/2013 9:00 AM Josph Macho, MD [place] [facility] [phone]   02/11/2013 10:00 AM [facility] Chair 3 [place] [facility] [phone]   02/24/2013 12:00 PM Levert Feinstein, MD [facility] [phone]   04/09/2013 9:00 AM Nilda Riggs, NP  [facility] [phone]   Future Orders Complete By Expires     Call MD for:  difficulty breathing, headache or visual disturbances  As directed     Call MD for:  extreme fatigue  As directed     Call MD for:  persistant dizziness or light-headedness  As directed     Call MD for:  persistant nausea and vomiting  As directed     Call MD for:  severe uncontrolled pain  As directed     Call MD for:  temperature >100.4  As directed     Diet - low sodium heart healthy  As directed     Increase activity slowly  As directed         Medication List    STOP taking these medications       ondansetron 8 MG tablet  Commonly known as:  ZOFRAN     oxycodone 5 MG capsule  Commonly known as:  OXY-IR     potassium chloride SA 20 MEQ tablet  Commonly known as:  K-DUR,KLOR-CON     PRESCRIPTION MEDICATION     prochlorperazine 10 MG tablet  Commonly known as:  COMPAZINE     triamterene-hydrochlorothiazide 37.5-25 MG per tablet  Commonly known as:  MAXZIDE-25      TAKE these medications       benzonatate 200 MG capsule  Commonly  known as:  TESSALON  Take 1 capsule (200 mg total) by mouth 3 (three) times daily as needed for cough.     CALTRATE 600+D PO  Take 1 tablet by mouth 2 (two) times daily.     dronabinol 2.5 MG capsule  Commonly known as:  MARINOL  Take 1 capsule (2.5 mg total) by mouth 3 (three) times daily before meals.     enoxaparin 120 MG/0.8ML injection  Commonly known as:  LOVENOX  Inject 0.73 mLs (110 mg total) into the skin daily.     ezetimibe 10 MG tablet  Commonly known as:  ZETIA  Take 10 mg by mouth every evening.     feeding supplement Liqd  Take 1 Container by mouth 3 (three) times daily between meals.     fentaNYL 25 MCG/HR  Commonly known as:  DURAGESIC - dosed mcg/hr  Place 1 patch (25 mcg total) onto the skin every 3 (three) days.     folic acid 1 MG tablet  Commonly known as:  FOLVITE  Take 1 mg by mouth daily.      HYDROmorphone 2 MG tablet  Commonly known as:  DILAUDID  Take 1-2 tablets (2-4 mg total) by mouth every 4 (four) hours as needed for pain.     lidocaine-prilocaine cream  Commonly known as:  EMLA  Apply 1 application topically as needed (for port-a-cath access).     LORazepam 0.5 MG tablet  Commonly known as:  ATIVAN  Take 1 tablet (0.5 mg total) by mouth every 6 (six) hours as needed (Nausea or vomiting).     Lysine 500 MG Tabs  Take 500 mg by mouth daily as needed (out breaks).     magic mouthwash Soln  Swish and spit 15 mLs 3 (three) times daily as needed (for thrush).     metoCLOPramide 10 MG tablet  Commonly known as:  REGLAN  Take 1 tablet (10 mg total) by mouth every 6 (six) hours as needed (Nausea & vomiting).     multivitamin with minerals Tabs  Take 1 tablet by mouth daily.     omeprazole 20 MG capsule  Commonly known as:  PRILOSEC  Take 20 mg by mouth daily.     pramipexole 1 MG tablet  Commonly known as:  MIRAPEX  Take 1-1.5 mg by mouth 3 (three) times daily. 1 tablets bid and 1.5 tablets at bedtime     pyridostigmine 60 MG tablet  Commonly known as:  MESTINON  Take 60 mg by mouth 3 (three) times daily.     valACYclovir 500 MG tablet  Commonly known as:  VALTREX  Take 500 mg by mouth daily as needed (for outbreaks).     Vitamin D 2000 UNITS Caps  Take 2,000 Units by mouth daily.     zolpidem 6.25 MG CR tablet  Commonly known as:  AMBIEN CR  Take 6.25 mg by mouth at bedtime.       Follow-up Information   Follow up with Josph Macho, MD. Schedule an appointment as soon as possible for a visit in 1 week.   Contact information:   [address] [place] [address] [phone]        The results of significant diagnostics from this hospitalization (including imaging, microbiology, ancillary and laboratory) are listed below for reference.    Significant Diagnostic Studies: Ct Chest W Contrast  12/08/2012   *RADIOLOGY REPORT*   Clinical Data: Chemotherapy for lung cancer.  Radiation therapy complete.  Chest pain.  Cough.  Shortness of breath.  History of Parkinson's disease.  CT CHEST WITH CONTRAST  Technique:  Multidetector CT imaging of the chest was performed following the standard protocol during bolus administration of intravenous contrast.  Contrast: 80mL OMNIPAQUE IOHEXOL 300 MG/ML  SOLN  Comparison: Today's PET.  Prior PET of 09/09/2012 and CT of 08/31/2012.  Findings: Lungs/pleura: Decreased size of central left lower lobe lung nodule.  This measures 2.8 x 1.7 cm today versus 3.0 x 4.0 cm at the same level on the prior exam.  Left paramediastinal interstitial opacity with traction bronchiectasis, likely radiation induced.  Other scattered areas of interstitial opacity identified throughout both lungs.  Somewhat more confluent at the left lower lobe, including image 30/series 7.  No pleural fluid.   1.1 cm left-sided pleural nodule which corresponds to hypermetabolism at PET.  Image 20 today.  Heart/Mediastinum: Incompletely imaged low left jugular/supraclavicular adenopathy which corresponds to hypermetabolism at PET. Right-sided Port-A-Cath which terminates at the low SVC.  Normal heart size, without pericardial effusion.  Large central pulmonary embolism, with emboli identified within both pulmonary arteries and the segmental branches lower lobes. Example images 21 - 24/series 2.  No evidence of right heart strain.  Subtle node inferior to the left inferior pulmonary vein corresponds to hypermetabolism.  This measures 1.0 cm on image 27.  Upper abdomen: New right liver lobe 9 mm lesion image 48/series 2, corresponding hypermetabolism at PET. More inferior right hepatic lobe 8 mm lesion on image 4/series 4.  Normal adrenal glands.  Bones/Musculoskeletal:  Sternal manubrial metastasis again identified.  Soft tissue density in the subcutaneous fat superficially likely the site of biopsy.  Extensive osseous metastasis are otherwise  relatively CT occult.  IMPRESSION:  1.  Large burden central pulmonary embolism.  Findings were discussed with Dr. Gustavo Lah physician's assistant, Inetta Fermo, at 12 5:00 p.m. I also discussed findings with the patient and she was sent to the emergency room. 2.  Decreased left lower lobe lung nodule size. 3.  Development of thoracic and lower cervical nodal metastasis. 4.  Progression of osseous metastasis, most apparent at PET. 5.  Development of hepatic metastasis. 6.  Left pleural metastasis, new. 7. At least partially radiation used interstitial opacity throughout the lungs.  Difficult to exclude drug toxicity or early lymphangitic tumor spread.   Original Report Authenticated By: Jeronimo Greaves, M.D.   Nm Pet Image Restag (ps) Skull Base To Thigh  12/08/2012   *RADIOLOGY REPORT*  Clinical Data: Subsequent treatment strategy for restaging of metastatic left-sided lung cancer.  Status post chemotherapy.  NUCLEAR MEDICINE PET SKULL BASE TO THIGH  Fasting Blood Glucose:  133  Technique:  19.5 mCi F-18 FDG was injected intravenously. CT data was obtained and used for attenuation correction and anatomic localization only.  (This was not acquired as a diagnostic CT examination.) Additional exam technical data entered on technologist worksheet.  Comparison:  09/09/2012  Findings:  Neck: A hypermetabolic right lower neck subcutaneous nodule which measures 8 mm and a S.U.V. max of 5.5 on image 35/series 2. Development of left supraclavicular/low cervical adenopathy.  1.2 cm and a S.U.V. max of 15.7 on image 42/series 2.  Chest:  Hypermetabolism corresponding to the left perimediastinal interstitial opacity which is favored to be radiation induced.  New hypermetabolism about the low left posterior mediastinum.  This likely corresponds to a necrotic 11 mm node on image 27/series 2 of today's CT.  This measures a S.U.V. max of 10.5.  A new left-sided  pleural hypermetabolic nodule on image 78.  Central left lower lobe lung nodule  is decreased in size, measures 2.3 cm today versus 4.5 cm on the prior exam.  This measures a S.U.V. max of 12.6.  Abdomen/Pelvis:  Development of hepatic metastasis.  Index high right hepatic lobe lesion measures 1.1 cm (on the diagnostic CT of same date) and a S.U.V. max of 13.7 on image 110.  Probable physiologic hypermetabolism within the cecum.Low level hypermetabolism within the left inguinal region corresponds to edema surrounding the left external iliac and common femoral veins.  Skeleton:  Progression of osseous metastasis.  Multiple new lesions.  Index lesion in the left glenoid measures a S.U.V. max of 6.7 on image 53/series 2.  There is hypermetabolism about the sternum and along the presumed biopsy tract.  Pathologic fracture identified, as before.  New lesions also identified within the right iliac and sacral bones.  CT  images performed for attenuation correction demonstrate no further findings within the neck.  Chest findings deferred to today's diagnostic CTs.  Right nephrolithiasis.  Right hepatic lobe cyst.  Pelvic floor laxity.  Cystic lesion within the right adnexa which is similar in size at 6.0 cm.  IMPRESSION:  1.  Decreased size of left lower lobe lung nodule. 2.  Development of nodal metastasis within the left mediastinum and low left neck. 3.  New hepatic, left pleural and subcutaneous metastasis. 4.  Progression of osseous metastasis. 5.  Left groin edema and hypermetabolism, likely the site of deep venous thrombosis (please see diagnostic chest CT report for description of pulmonary embolism).   Original Report Authenticated By: Jeronimo Greaves, M.D.   VASCULAR LAB  PRELIMINARY PRELIMINARY PRELIMINARY PRELIMINARY  Bilateral lower extremity venous duplex completed.  Preliminary report: Right: DVT noted in the distal CFV through the PTV and peroneal veins. No evidence of superficial thrombosis. No Baker's cyst. Left: DVT noted in the CFV through the PTV and peroneal veins and including  the gasgrocnemius veins. No evidence of superficial thrombosis. No Baker's cyst.   Microbiology: Recent Results (from the past 240 hour(s))  MRSA PCR SCREENING     Status: None   Collection Time    12/08/12  5:23 PM      Result Value Range Status   MRSA by PCR NEGATIVE  NEGATIVE Final   Comment:            The GeneXpert MRSA Assay (FDA     approved for NASAL specimens     only), is one component of a     comprehensive MRSA colonization     surveillance program. It is not     intended to diagnose MRSA     infection nor to guide or     monitor treatment for     MRSA infections.     Labs: Basic Metabolic Panel:  Recent Labs Lab 12/09/12 0420 12/10/12 0345  NA 131* 132*  K 4.0 3.9  CL 96 97  CO2 25 24  GLUCOSE 109* 147*  BUN 7 5*  CREATININE 0.50 0.43*  CALCIUM 8.3* 8.6   Liver Function Tests: No results found for this basename: AST, ALT, ALKPHOS, BILITOT, PROT, ALBUMIN,  in the last 168 hours No results found for this basename: LIPASE, AMYLASE,  in the last 168 hours No results found for this basename: AMMONIA,  in the last 168 hours CBC:  Recent Labs Lab 12/10/12 0345 12/11/12 0440 12/12/12 0500 12/13/12 0515 12/14/12 0530  WBC 10.1 10.0 9.4 9.1 10.5  HGB 9.9* 10.2* 9.7* 9.6* 9.8*  HCT 30.7* 31.8* 31.1* 31.1* 32.0*  MCV 90.0 92.7 92.3 93.1 93.8  PLT 318 352 333 331 355   Cardiac Enzymes: No results found for this basename: CKTOTAL, CKMB, CKMBINDEX, TROPONINI,  in the last 168 hours BNP: BNP (last 3 results) No results found for this basename: PROBNP,  in the last 8760 hours CBG: No results found for this basename: GLUCAP,  in the last 168 hours  Additional labs:  Prealbumin: 6.4   Signed:  Jubilee Vivero  [facility] 12/15/2012, 6:51 PM

## 2012-12-14 NOTE — Care Management Note (Signed)
Cm spoke with patient and spouse at the bedside concerning discharge disposition of Home Hospice. Per patient and spouse [facility] to provide Home Hospice Services. Home Hospice referral was made and arranged by Dr.Ennever. [facility] liason Valente David aware of referral. [facility] to contact family upon discharge home. Pt preferres [facility] contact her upon discharge, no questions for [facility] at this time. Pt and spouse requested no equipment for home use. Pt states having two Rws, BSC, and other DME donated from friends and family.  Pt on room air. No other needs identified. Cm provided pt and family with CM contact information if any further questions.    Roxy Manns Shiva Sahagian,RN,BSN [phone]

## 2012-12-14 NOTE — Progress Notes (Signed)
Notified by [facility] that a referral was received from Dr Gustavo Lah office; spoke with [facility] and stopped by patient's room to confirm with patient and husband that [facility] had received referral from Dr Gustavo Lah office; they voiced they are aware that patient's discharge may not happen until later this afternoon and would prefer for [facility] to see patient at home tomorrow morning; husband stated he felt that they would do OK until then and did not feel patient would need any DME; they plan to take patient home by personal vehicle - [facility] made aware of patient/husband request and confirmed meeting time for tomorrow, Tuesday 12/15/12 @ 11:00 am for [facility] Assessment Nurse Visit.  Patient and husband appreciative of information and visit.   Valente David, RN 12/14/2012, 11:49 AM [facility] Palliative Medicine Team RN Liaison [phone]

## 2012-12-15 ENCOUNTER — Telehealth: Payer: Self-pay | Admitting: Hematology & Oncology

## 2012-12-15 NOTE — Telephone Encounter (Signed)
Patient called and sch lab and md apt for 12/22/12

## 2012-12-16 ENCOUNTER — Other Ambulatory Visit: Payer: Self-pay | Admitting: Hematology & Oncology

## 2012-12-16 DIAGNOSIS — C7802 Secondary malignant neoplasm of left lung: Secondary | ICD-10-CM

## 2012-12-16 DIAGNOSIS — I82403 Acute embolism and thrombosis of unspecified deep veins of lower extremity, bilateral: Secondary | ICD-10-CM

## 2012-12-16 DIAGNOSIS — I2699 Other pulmonary embolism without acute cor pulmonale: Secondary | ICD-10-CM

## 2012-12-17 ENCOUNTER — Encounter: Payer: Self-pay | Admitting: *Deleted

## 2012-12-17 ENCOUNTER — Other Ambulatory Visit: Payer: Self-pay | Admitting: *Deleted

## 2012-12-17 DIAGNOSIS — C7800 Secondary malignant neoplasm of unspecified lung: Secondary | ICD-10-CM

## 2012-12-17 DIAGNOSIS — R110 Nausea: Secondary | ICD-10-CM

## 2012-12-17 MED ORDER — HYDROMORPHONE HCL 2 MG PO TABS: 2.0000 mg | ORAL_TABLET | ORAL | Status: AC | PRN

## 2012-12-17 MED ORDER — VALACYCLOVIR HCL 500 MG PO TABS: 500.0000 mg | ORAL_TABLET | Freq: Every day | ORAL | Status: AC | PRN

## 2012-12-17 MED ORDER — METOCLOPRAMIDE HCL 10 MG PO TABS: 10.0000 mg | ORAL_TABLET | Freq: Four times a day (QID) | ORAL | Status: AC | PRN

## 2012-12-17 NOTE — Telephone Encounter (Signed)
Pt's husband called requesting refills on her Reglan and Dilaudid. Needs to make sure she doesn't run out over the weekend. He stated her pain was well controlled with the current regimen and declined any adjustments. E-prescribed Reglan will fax Dilaudid to [facility].

## 2012-12-17 NOTE — Progress Notes (Signed)
Histology and Location of Primary Cancer: LLL lung  Sites of Visceral and Bony Metastatic Disease: sternum, left mediastnum, low left neck, hepatic, left pleural mets, subcutaneous mets, progression of osseous mets  Location(s) of Symptomatic Metastases: sternum, upper right back  Past/Anticipated chemotherapy by medical oncology, if any: s/p 2nd cycle chemotherapy  Pain on a scale of 0-10 is:  0   If Spine Met(s), symptoms, if any, include:  Bowel/Bladder retention or incontinence (please describe): none  Numbness or weakness in extremities (please describe): general weakness  Current Decadron regimen, if applicable: none   Ambulatory status? Walker? Wheelchair?: wheelchair  SAFETY ISSUES:  Prior radiation?  09/29/12- 10/19/12 sternal lesion, LLL lung  Pacemaker/ICD? no  Possible current pregnancy? no  Is the patient on methotrexate? no  Current Complaints / other details:  Hospice pt as of 12/10/12. Pt has red raised nodule on sternum and upper right back. She states these areas are painful to touch. She reports occasional p[ain in her lower ribs, prod cough w/thick clear to white sputum, SOB w/coughing spells and exertion such as showering. Edema of feet/ankles. Pt on Lovenox s/p hospitalization for blood clots.

## 2012-12-21 ENCOUNTER — Encounter: Payer: Self-pay | Admitting: Radiation Oncology

## 2012-12-21 ENCOUNTER — Ambulatory Visit
Admit: 2012-12-21 | Discharge: 2012-12-21 | Disposition: A | Discharge status: Home / Self Care | Attending: Radiation Oncology | Admitting: Radiation Oncology

## 2012-12-21 VITALS — BP 109/84 | HR 108 | Temp 98.6°F | Resp 20 | Wt 159.0 lb

## 2012-12-21 DIAGNOSIS — Z79899 Other long term (current) drug therapy: Secondary | ICD-10-CM | POA: Insufficient documentation

## 2012-12-21 DIAGNOSIS — C787 Secondary malignant neoplasm of liver and intrahepatic bile duct: Secondary | ICD-10-CM | POA: Insufficient documentation

## 2012-12-21 DIAGNOSIS — C7951 Secondary malignant neoplasm of bone: Secondary | ICD-10-CM | POA: Insufficient documentation

## 2012-12-21 DIAGNOSIS — C7800 Secondary malignant neoplasm of unspecified lung: Secondary | ICD-10-CM

## 2012-12-21 DIAGNOSIS — C3490 Malignant neoplasm of unspecified part of unspecified bronchus or lung: Secondary | ICD-10-CM | POA: Insufficient documentation

## 2012-12-21 DIAGNOSIS — C50919 Malignant neoplasm of unspecified site of unspecified female breast: Secondary | ICD-10-CM | POA: Insufficient documentation

## 2012-12-21 DIAGNOSIS — C7802 Secondary malignant neoplasm of left lung: Secondary | ICD-10-CM

## 2012-12-21 MED ORDER — PROCHLORPERAZINE MALEATE 10 MG PO TABS
10.0000 mg | ORAL_TABLET | Freq: Once | ORAL | Status: AC
  Administered 2012-12-21: 10 mg via ORAL
  Filled 2012-12-21: qty 1

## 2012-12-21 MED ORDER — PROCHLORPERAZINE MALEATE 10 MG PO TABS: 10.0000 mg | ORAL_TABLET | Freq: Once | ORAL | Status: DC

## 2012-12-21 NOTE — Progress Notes (Signed)
Radiation Oncology         ([phone]) [phone] ________________________________  Name: CHARMEKA FREEBURG MRN: [id number]  Date: 12/21/2012  DOB: 1946-04-23  Follow-Up Visit Note  CC: Josph Macho, MD  Josph Macho, MD  Diagnosis:   Metastatic non-small cell lung cancer  Interval Since Last Radiation:  2  months  Narrative:  The patient returns today for further evaluation and consideration for palliative radiation therapy for management of patient's progressive non-small cell lung cancer. After completing her radiation therapy directed at the sternum and left lower lung area, the patient proceeded with chemotherapy.  Unfortunately on recent PET scan patient was noted to have progressive disease with liver and bone as well as subcutaneous metastasis.  the patient is having discomfort and there is concern for skin erosion and bleeding with lesions noted along the sternum and right upper back. The patient is seen today for consideration for radiation therapy directed at these areas.                          ALLERGIES:  has No Known Allergies.  Meds: Current Outpatient Prescriptions  Medication Sig Dispense Refill  . Alum & Mag Hydroxide-Simeth (MAGIC MOUTHWASH) SOLN Swish and spit 15 mLs 3 (three) times daily as needed (for thrush).      . benzonatate (TESSALON) 200 MG capsule Take 1 capsule (200 mg total) by mouth 3 (three) times daily as needed for cough.  50 capsule  0  . Calcium Carbonate-Vitamin D (CALTRATE 600+D PO) Take 1 tablet by mouth 2 (two) times daily.      . Cholecalciferol (VITAMIN D) 2000 UNITS CAPS Take 2,000 Units by mouth daily.       Marland Kitchen dronabinol (MARINOL) 2.5 MG capsule Take 1 capsule (2.5 mg total) by mouth 3 (three) times daily before meals.  60 capsule  0  . enoxaparin (LOVENOX) 120 MG/0.8ML injection Inject 0.73 mLs (110 mg total) into the skin daily.  30 Syringe  0  . ezetimibe (ZETIA) 10 MG tablet Take 10 mg by mouth every evening.      . feeding supplement (RESOURCE  BREEZE) LIQD Take 1 Container by mouth 3 (three) times daily between meals.      . fentaNYL (DURAGESIC - DOSED MCG/HR) 25 MCG/HR Place 1 patch (25 mcg total) onto the skin every 3 (three) days.  5 patch  0  . fluconazole (DIFLUCAN) 100 MG tablet       . folic acid (FOLVITE) 1 MG tablet Take 1 mg by mouth daily.      Marland Kitchen HYDROmorphone (DILAUDID) 2 MG tablet Take 1-2 tablets (2-4 mg total) by mouth every 4 (four) hours as needed for pain (HOSPICE PATIENT).  90 tablet  0  . lidocaine-prilocaine (EMLA) cream Apply 1 application topically as needed (for port-a-cath access).       . LORazepam (ATIVAN) 0.5 MG tablet       . Lysine 500 MG TABS Take 500 mg by mouth daily as needed (out breaks).       . metoCLOPramide (REGLAN) 10 MG tablet Take 1 tablet (10 mg total) by mouth every 6 (six) hours as needed (Nausea & vomiting. HOSPICE PATIENT).  60 tablet  1  . Multiple Vitamin (MULTIVITAMIN WITH MINERALS) TABS Take 1 tablet by mouth daily.      Marland Kitchen omeprazole (PRILOSEC) 20 MG capsule Take 20 mg by mouth daily.      . pramipexole (MIRAPEX) 1 MG tablet Take  1-1.5 mg by mouth 3 (three) times daily. 1 tablets bid and 1.5 tablets at bedtime      . pyridostigmine (MESTINON) 60 MG tablet Take 60 mg by mouth 3 (three) times daily.      . valACYclovir (VALTREX) 500 MG tablet Take 1 tablet (500 mg total) by mouth daily as needed (for outbreaks HOSPICE PATIENT).  30 tablet  1  . zolpidem (AMBIEN CR) 6.25 MG CR tablet Take 6.25 mg by mouth at bedtime.        No current facility-administered medications for this encounter.    Physical Findings: The patient is in no acute distress. Patient is alert and oriented.  weight is 159 lb (72.122 kg). Her oral temperature is 98.6 F (37 C). Her blood pressure is 109/84 and her pulse is 108. Her respiration is 20 and oxygen saturation is 95%. .  The right upper back shows approximately a 2 cm erythematous nodule which is painful with palpation. This is consistent with subcutaneous  metastasis.  This appears to be the area that was positive on recent PET scan.  Along the mid sternum area the patient has a erythematous nodule which on palpation is estimated to be approximately a 4 x 5 cm. This also is uncomfortable to the patient with palpation.  Centered within this larger area is a smaller approximately 1 cm nodule. There is no obvious skin erosion at this time.  Lab Findings: Lab Results  Component Value Date   WBC 10.5 12/14/2012   HGB 9.8* 12/14/2012   HCT 32.0* 12/14/2012   MCV 93.8 12/14/2012   PLT 355 12/14/2012    @LASTCHEM @  Radiographic Findings: Ct Chest W Contrast  12/08/2012   *RADIOLOGY REPORT*  Clinical Data: Chemotherapy for lung cancer.  Radiation therapy complete.  Chest pain.  Cough.  Shortness of breath.  History of Parkinson's disease.  CT CHEST WITH CONTRAST  Technique:  Multidetector CT imaging of the chest was performed following the standard protocol during bolus administration of intravenous contrast.  Contrast: 80mL OMNIPAQUE IOHEXOL 300 MG/ML  SOLN  Comparison: Today's PET.  Prior PET of 09/09/2012 and CT of 08/31/2012.  Findings: Lungs/pleura: Decreased size of central left lower lobe lung nodule.  This measures 2.8 x 1.7 cm today versus 3.0 x 4.0 cm at the same level on the prior exam.  Left paramediastinal interstitial opacity with traction bronchiectasis, likely radiation induced.  Other scattered areas of interstitial opacity identified throughout both lungs.  Somewhat more confluent at the left lower lobe, including image 30/series 7.  No pleural fluid.   1.1 cm left-sided pleural nodule which corresponds to hypermetabolism at PET.  Image 20 today.  Heart/Mediastinum: Incompletely imaged low left jugular/supraclavicular adenopathy which corresponds to hypermetabolism at PET. Right-sided Port-A-Cath which terminates at the low SVC.  Normal heart size, without pericardial effusion.  Large central pulmonary embolism, with emboli identified within both  pulmonary arteries and the segmental branches lower lobes. Example images 21 - 24/series 2.  No evidence of right heart strain.  Subtle node inferior to the left inferior pulmonary vein corresponds to hypermetabolism.  This measures 1.0 cm on image 27.  Upper abdomen: New right liver lobe 9 mm lesion image 48/series 2, corresponding hypermetabolism at PET. More inferior right hepatic lobe 8 mm lesion on image 4/series 4.  Normal adrenal glands.  Bones/Musculoskeletal:  Sternal manubrial metastasis again identified.  Soft tissue density in the subcutaneous fat superficially likely the site of biopsy.  Extensive osseous metastasis are otherwise relatively  CT occult.  IMPRESSION:  1.  Large burden central pulmonary embolism.  Findings were discussed with Dr. Gustavo Lah physician's assistant, Inetta Fermo, at 12 5:00 p.m. I also discussed findings with the patient and she was sent to the emergency room. 2.  Decreased left lower lobe lung nodule size. 3.  Development of thoracic and lower cervical nodal metastasis. 4.  Progression of osseous metastasis, most apparent at PET. 5.  Development of hepatic metastasis. 6.  Left pleural metastasis, new. 7. At least partially radiation used interstitial opacity throughout the lungs.  Difficult to exclude drug toxicity or early lymphangitic tumor spread.   Original Report Authenticated By: Jeronimo Greaves, M.D.   Nm Pet Image Restag (ps) Skull Base To Thigh  12/08/2012   *RADIOLOGY REPORT*  Clinical Data: Subsequent treatment strategy for restaging of metastatic left-sided lung cancer.  Status post chemotherapy.  NUCLEAR MEDICINE PET SKULL BASE TO THIGH  Fasting Blood Glucose:  133  Technique:  19.5 mCi F-18 FDG was injected intravenously. CT data was obtained and used for attenuation correction and anatomic localization only.  (This was not acquired as a diagnostic CT examination.) Additional exam technical data entered on technologist worksheet.  Comparison:  09/09/2012  Findings:  Neck:  A hypermetabolic right lower neck subcutaneous nodule which measures 8 mm and a S.U.V. max of 5.5 on image 35/series 2. Development of left supraclavicular/low cervical adenopathy.  1.2 cm and a S.U.V. max of 15.7 on image 42/series 2.  Chest:  Hypermetabolism corresponding to the left perimediastinal interstitial opacity which is favored to be radiation induced.  New hypermetabolism about the low left posterior mediastinum.  This likely corresponds to a necrotic 11 mm node on image 27/series 2 of today's CT.  This measures a S.U.V. max of 10.5.  A new left-sided pleural hypermetabolic nodule on image 78.  Central left lower lobe lung nodule is decreased in size, measures 2.3 cm today versus 4.5 cm on the prior exam.  This measures a S.U.V. max of 12.6.  Abdomen/Pelvis:  Development of hepatic metastasis.  Index high right hepatic lobe lesion measures 1.1 cm (on the diagnostic CT of same date) and a S.U.V. max of 13.7 on image 110.  Probable physiologic hypermetabolism within the cecum.Low level hypermetabolism within the left inguinal region corresponds to edema surrounding the left external iliac and common femoral veins.  Skeleton:  Progression of osseous metastasis.  Multiple new lesions.  Index lesion in the left glenoid measures a S.U.V. max of 6.7 on image 53/series 2.  There is hypermetabolism about the sternum and along the presumed biopsy tract.  Pathologic fracture identified, as before.  New lesions also identified within the right iliac and sacral bones.  CT  images performed for attenuation correction demonstrate no further findings within the neck.  Chest findings deferred to today's diagnostic CTs.  Right nephrolithiasis.  Right hepatic lobe cyst.  Pelvic floor laxity.  Cystic lesion within the right adnexa which is similar in size at 6.0 cm.  IMPRESSION:  1.  Decreased size of left lower lobe lung nodule. 2.  Development of nodal metastasis within the left mediastinum and low left neck. 3.  New  hepatic, left pleural and subcutaneous metastasis. 4.  Progression of osseous metastasis. 5.  Left groin edema and hypermetabolism, likely the site of deep venous thrombosis (please see diagnostic chest CT report for description of pulmonary embolism).   Original Report Authenticated By: Jeronimo Greaves, M.D.    Impression:  Progressive non-small cell lung cancer. The patient  would be a candidate for a short course of palliative radiation therapy directed at her areas of subcutaneous metastasis.  The the patient would be a good candidate for superficial radiation therapy (electrons)   Plan:  The patient will return later this week to set up her electron field directed at the areas of subcutaneous metastasis.  _____________________________________    Special treatment procedure note  Additional time was taken in reviewing the patient's previous radiation therapy as it relates to her current planned treatment. Given the additional time in reviewing the patient's previous treatment as it relates to her current set up, this constitutes a special treatment procedure. -----------------------------------  Billie Lade, PhD, MD

## 2012-12-22 ENCOUNTER — Other Ambulatory Visit (HOSPITAL_BASED_OUTPATIENT_CLINIC_OR_DEPARTMENT_OTHER): Payer: Medicare Other | Admitting: Lab

## 2012-12-22 ENCOUNTER — Ambulatory Visit (HOSPITAL_BASED_OUTPATIENT_CLINIC_OR_DEPARTMENT_OTHER): Payer: Medicare Other | Admitting: Hematology & Oncology

## 2012-12-22 ENCOUNTER — Ambulatory Visit (HOSPITAL_BASED_OUTPATIENT_CLINIC_OR_DEPARTMENT_OTHER): Payer: Medicare Other

## 2012-12-22 VITALS — BP 101/62 | HR 100 | Temp 98.0°F | Resp 16 | Wt 157.0 lb

## 2012-12-22 DIAGNOSIS — C3430 Malignant neoplasm of lower lobe, unspecified bronchus or lung: Secondary | ICD-10-CM

## 2012-12-22 DIAGNOSIS — C7951 Secondary malignant neoplasm of bone: Secondary | ICD-10-CM

## 2012-12-22 DIAGNOSIS — I2699 Other pulmonary embolism without acute cor pulmonale: Secondary | ICD-10-CM

## 2012-12-22 DIAGNOSIS — I82403 Acute embolism and thrombosis of unspecified deep veins of lower extremity, bilateral: Secondary | ICD-10-CM

## 2012-12-22 DIAGNOSIS — C7802 Secondary malignant neoplasm of left lung: Secondary | ICD-10-CM

## 2012-12-22 DIAGNOSIS — I82409 Acute embolism and thrombosis of unspecified deep veins of unspecified lower extremity: Secondary | ICD-10-CM

## 2012-12-22 DIAGNOSIS — C7952 Secondary malignant neoplasm of bone marrow: Secondary | ICD-10-CM

## 2012-12-22 LAB — CBC WITH DIFFERENTIAL (CANCER CENTER ONLY)
BASO#: 0.1 10*3/uL (ref 0.0–0.2)
HCT: 34.2 % — ABNORMAL LOW (ref 34.8–46.6)
HGB: 10.9 g/dL — ABNORMAL LOW (ref 11.6–15.9)
LYMPH#: 0.6 10*3/uL — ABNORMAL LOW (ref 0.9–3.3)
LYMPH%: 4.4 % — ABNORMAL LOW (ref 14.0–48.0)
MCV: 95 fL (ref 81–101)
MONO#: 1.8 10*3/uL — ABNORMAL HIGH (ref 0.1–0.9)
NEUT%: 81.2 % — ABNORMAL HIGH (ref 39.6–80.0)
WBC: 12.9 10*3/uL — ABNORMAL HIGH (ref 3.9–10.0)

## 2012-12-22 LAB — CMP (CANCER CENTER ONLY)
Albumin: 2.8 g/dL — ABNORMAL LOW (ref 3.3–5.5)
CO2: 29 mEq/L (ref 18–33)
Calcium: 10 mg/dL (ref 8.0–10.3)
Chloride: 94 mEq/L — ABNORMAL LOW (ref 98–108)
Glucose, Bld: 145 mg/dL — ABNORMAL HIGH (ref 73–118)
Potassium: 3.8 mEq/L (ref 3.3–4.7)
Sodium: 132 mEq/L (ref 128–145)
Total Bilirubin: 0.7 mg/dl (ref 0.20–1.60)
Total Protein: 7.3 g/dL (ref 6.4–8.1)

## 2012-12-22 LAB — TECHNOLOGIST REVIEW CHCC SATELLITE

## 2012-12-22 MED ORDER — HEPARIN SOD (PORK) LOCK FLUSH 100 UNIT/ML IV SOLN
250.0000 [IU] | Freq: Once | INTRAVENOUS | Status: DC | PRN
  Filled 2012-12-22: qty 5

## 2012-12-22 MED ORDER — SODIUM CHLORIDE 0.9 % IV SOLN
Freq: Once | INTRAVENOUS | Status: AC
  Administered 2012-12-22: 250 mL via INTRAVENOUS

## 2012-12-22 MED ORDER — SODIUM CHLORIDE 0.9 % IJ SOLN
10.0000 mL | INTRAMUSCULAR | Status: DC | PRN
  Administered 2012-12-22: 10 mL
  Filled 2012-12-22: qty 10

## 2012-12-22 MED ORDER — ZOLEDRONIC ACID 4 MG/5ML IV CONC: 4.0000 mg | Freq: Once | INTRAVENOUS | Status: DC

## 2012-12-22 MED ORDER — SODIUM CHLORIDE 0.9 % IJ SOLN
3.0000 mL | Freq: Once | INTRAMUSCULAR | Status: DC | PRN
  Filled 2012-12-22: qty 10

## 2012-12-22 MED ORDER — ZOLEDRONIC ACID 4 MG/100ML IV SOLN
4.0000 mg | Freq: Once | INTRAVENOUS | Status: AC
  Administered 2012-12-22: 4 mg via INTRAVENOUS
  Filled 2012-12-22: qty 100

## 2012-12-22 MED ORDER — HEPARIN SOD (PORK) LOCK FLUSH 100 UNIT/ML IV SOLN
500.0000 [IU] | Freq: Once | INTRAVENOUS | Status: AC | PRN
  Administered 2012-12-22: 500 [IU]
  Filled 2012-12-22: qty 5

## 2012-12-22 NOTE — Patient Instructions (Signed)
Zoledronic Acid injection (Hypercalcemia, Oncology) What is this medicine? ZOLEDRONIC ACID (ZOE le dron ik AS id) lowers the amount of calcium loss from bone. It is used to treat too much calcium in your blood from cancer. It is also used to prevent complications of cancer that has spread to the bone. This medicine may be used for other purposes; ask your health care provider or pharmacist if you have questions. What should I tell my health care provider before I take this medicine? They need to know if you have any of these conditions: -aspirin-sensitive asthma -dental disease -kidney disease -an unusual or allergic reaction to zoledronic acid, other medicines, foods, dyes, or preservatives -pregnant or trying to get pregnant -breast-feeding How should I use this medicine? This medicine is for infusion into a vein. It is given by a health care professional in a hospital or clinic setting. Talk to your pediatrician regarding the use of this medicine in children. Special care may be needed. Overdosage: If you think you have taken too much of this medicine contact a poison control center or emergency room at once. NOTE: This medicine is only for you. Do not share this medicine with others. What if I miss a dose? It is important not to miss your dose. Call your doctor or health care professional if you are unable to keep an appointment. What may interact with this medicine? -certain antibiotics given by injection -NSAIDs, medicines for pain and inflammation, like ibuprofen or naproxen -some diuretics like bumetanide, furosemide -teriparatide -thalidomide This list may not describe all possible interactions. Give your health care provider a list of all the medicines, herbs, non-prescription drugs, or dietary supplements you use. Also tell them if you smoke, drink alcohol, or use illegal drugs. Some items may interact with your medicine. What should I watch for while using this medicine? Visit  your doctor or health care professional for regular checkups. It may be some time before you see the benefit from this medicine. Do not stop taking your medicine unless your doctor tells you to. Your doctor may order blood tests or other tests to see how you are doing. Women should inform their doctor if they wish to become pregnant or think they might be pregnant. There is a potential for serious side effects to an unborn child. Talk to your health care professional or pharmacist for more information. You should make sure that you get enough calcium and vitamin D while you are taking this medicine. Discuss the foods you eat and the vitamins you take with your health care professional. Some people who take this medicine have severe bone, joint, and/or muscle pain. This medicine may also increase your risk for a broken thigh bone. Tell your doctor right away if you have pain in your upper leg or groin. Tell your doctor if you have any pain that does not go away or that gets worse. What side effects may I notice from receiving this medicine? Side effects that you should report to your doctor or health care professional as soon as possible: -allergic reactions like skin rash, itching or hives, swelling of the face, lips, or tongue -anxiety, confusion, or depression -breathing problems -changes in vision -feeling faint or lightheaded, falls -jaw burning, cramping, pain -muscle cramps, stiffness, or weakness -trouble passing urine or change in the amount of urine Side effects that usually do not require medical attention (report to your doctor or health care professional if they continue or are bothersome): -bone, joint, or muscle pain -  fever -hair loss -irritation at site where injected -loss of appetite -nausea, vomiting -stomach upset -tired This list may not describe all possible side effects. Call your doctor for medical advice about side effects. You may report side effects to FDA at  1-800-FDA-1088. Where should I keep my medicine? This drug is given in a hospital or clinic and will not be stored at home. NOTE: This sheet is a summary. It may not cover all possible information. If you have questions about this medicine, talk to your doctor, pharmacist, or health care provider.  2013, [employer]. (11/30/2010 9:06:58 AM)  

## 2012-12-22 NOTE — Progress Notes (Signed)
This office note has been dictated.

## 2012-12-23 ENCOUNTER — Telehealth: Payer: Self-pay | Admitting: Dietician

## 2012-12-23 NOTE — Progress Notes (Signed)
CC:   [facility]  DIAGNOSES: 1. Progressive adenocarcinoma of the lung. 2. Pulmonary embolism/deep vein thrombosis.  CURRENT THERAPY: 1. Zometa 4 mg IV monthly. 2. Lovenox 110 mg subcu daily. 3. Hospice care.  INTERIM HISTORY:  Ms. Zobel comes in for followup.  She was hospitalized a couple of weeks ago at [facility]. Unfortunately, she had metastatic disease that was progressive.  She had had a hard time with the chemotherapy.  Her prealbumin was about 7.  I felt that we really needed to focus on comfort care and get hospice involved.  Ms. Tye and her family agreed to this.  When she had her scans done, she was found to have a pulmonary embolism. She also was noted to have bilateral lower extremity DVTs.  She was started on heparin.  She was then transitioned over to Lovenox.  She actually looks much better than I would have thought.  Since being discharged, she has felt okay. She has seen Dr. Roselind Messier for palliative radiation therapy to a subcutaneous metastasis on the sternum.  There is also a metastasis in the posterior right shoulder.  She is not having any pain issues.  She is on a fentanyl patch at 25 mcg.  This has helped with respect to pain control.  She is on short- acting Dilaudid for breakthrough pain.  She is eating a little bit better.  She is having some nausea issues. She is on Reglan.  We are trying to avoid any kind of nausea medicines that will sedate her.  Her calcium is a little on the higher side.  We will treat the calcium level.  This may help with her nausea.  In talking with Ms. Azure and her family in the hospital, she is well aware that she likely will not survive more than 3 months.  She is a no code blue.  Again, she just wants to have quality of life.  Her family is very supportive of her.  They are really doing their best to try to help her out.  PHYSICAL EXAMINATION:  General:  This is a chronically  ill-appearing white female in no obvious distress.  Vital signs:  Temperature of 98, pulse 100, respiratory rate 16, blood pressure 101/62.  Weight is 157. Head and neck:  Normocephalic, atraumatic skull.  There are no ocular or oral lesions.  There are no palpable cervical or supraclavicular lymph nodes.  Lungs:  Clear bilaterally.  Cardiac:  Tachycardic but regular. There are no murmurs, rubs or bruits.  Abdomen:  Soft with good bowel sounds.  There is no palpable abdominal mass.  There is no fluid wave. There is no palpable hepatosplenomegaly.  Extremities:  Show some trace edema in her legs bilaterally.  Muscle strength is 4/5 bilaterally. Neurological:  Shows no focal neurological deficits.  LABORATORY STUDIES:  Show white cell count of 12.9, hemoglobin 10.9, hematocrit 34.2, platelet count 404.  Calcium is 10 with an albumin of 2.8.  IMPRESSION:  Mr. Rollings is a very nice 67 year old white female with metastatic adenocarcinoma of the lung.  She is wild type for the genetic "drivers."  She has elected no further chemotherapy.  I agree with this.  Her performance status is ECOG 3.  She will continue the Lovenox.  This she will need as long as she is with this.  We will go ahead with the Zometa today.  Hopefully, this will help with the hypercalcemia and maybe with the nausea.  I am glad that  hospice is seeing her.  I am sure they will help out quite a bit.  We will go ahead and plan to get her back in 3 or 4 more weeks.  I do not see any need to do any scans on her checking on the thromboembolic events.  I think as long as she is asymptomatic, we can consider this as a positive a sign that the anticoagulation is helping.    ______________________________ Josph Macho, M.D. PRE/MEDQ  D:  12/22/2012  T:  12/23/2012  Job:  [id number]

## 2012-12-24 ENCOUNTER — Ambulatory Visit
Admission: RE | Admit: 2012-12-24 | Discharge: 2012-12-24 | Disposition: A | Discharge status: Home / Self Care | Source: Ambulatory Visit | Attending: Radiation Oncology | Admitting: Radiation Oncology

## 2012-12-24 DIAGNOSIS — C3490 Malignant neoplasm of unspecified part of unspecified bronchus or lung: Secondary | ICD-10-CM | POA: Insufficient documentation

## 2012-12-24 DIAGNOSIS — C801 Malignant (primary) neoplasm, unspecified: Secondary | ICD-10-CM | POA: Insufficient documentation

## 2012-12-24 DIAGNOSIS — R229 Localized swelling, mass and lump, unspecified: Secondary | ICD-10-CM | POA: Insufficient documentation

## 2012-12-24 DIAGNOSIS — Z510 Encounter for antineoplastic radiation therapy: Secondary | ICD-10-CM | POA: Insufficient documentation

## 2012-12-24 NOTE — Progress Notes (Signed)
   Department of Radiation Oncology  Phone:  [phone] Fax:        [phone]  Electron beam simulation note  Today the patient had set up of her electron cutout field directed at the subcutaneous metastasis along the sternum and right upper back region. The lesions in both of these areas were noted. A prior electron cut out from previous treatment fit both of these areas well.  She will likely be treated with 9 Me V. electrons after review of electron calculation curves  Plan: Patient is to receive 4 treatments to both areas at 4 gray per treatment for a cumulative dose to both areas of 16 gray. A special port plan is requested for treatment.  -----------------------------------  Billie Lade, PhD, MD

## 2012-12-25 ENCOUNTER — Other Ambulatory Visit: Payer: Self-pay | Admitting: *Deleted

## 2012-12-25 DIAGNOSIS — C7802 Secondary malignant neoplasm of left lung: Secondary | ICD-10-CM

## 2012-12-25 DIAGNOSIS — R079 Chest pain, unspecified: Secondary | ICD-10-CM

## 2012-12-25 DIAGNOSIS — R05 Cough: Secondary | ICD-10-CM

## 2012-12-25 MED ORDER — DRONABINOL 2.5 MG PO CAPS: 2.5000 mg | ORAL_CAPSULE | Freq: Three times a day (TID) | ORAL | Status: AC

## 2012-12-25 MED ORDER — BENZONATATE 200 MG PO CAPS: 200.0000 mg | ORAL_CAPSULE | Freq: Three times a day (TID) | ORAL | Status: AC | PRN

## 2012-12-25 MED ORDER — FENTANYL 25 MCG/HR TD PT72: 1.0000 | MEDICATED_PATCH | TRANSDERMAL | Status: AC

## 2012-12-25 NOTE — Telephone Encounter (Signed)
Pt's husband called requesting refills for Fentanyl patches, Marinol, and Tessalon Pearls. Wants to make sure she doesn't run out over the weekend. He stated her pain was well controlled with the current regimen and declined any adjustments. Will fax prescriptions to [facility] once signed by Dr Myna Hidalgo.Marland Kitchen

## 2012-12-25 NOTE — Telephone Encounter (Signed)
Message forwarded to Triage Pool

## 2012-12-28 ENCOUNTER — Ambulatory Visit: Payer: Medicare Other

## 2012-12-29 ENCOUNTER — Ambulatory Visit
Admission: RE | Admit: 2012-12-29 | Discharge: 2012-12-29 | Disposition: A | Discharge status: Home / Self Care | Source: Ambulatory Visit | Attending: Radiation Oncology | Admitting: Radiation Oncology

## 2012-12-29 DIAGNOSIS — C7802 Secondary malignant neoplasm of left lung: Secondary | ICD-10-CM

## 2012-12-29 NOTE — Progress Notes (Signed)
[facility]    Radiation Oncology [address] [place]     Maryln Gottron, M.D. [place], [place] [address]               Billie Lade, M.D., Ph.D. Phone: [phone]      Molli Hazard A. Kathrynn Running, M.D. Fax: [phone]      Radene Gunning, M.D., Ph.D.         Lurline Hare, M.D.         Grayland Jack, M.D Weekly Treatment Management Note  Name: [name] Pacheco     MRN: [id number]        CSN: [id number] Date: 12/29/2012      DOB: 04-17-1946  CC: Josph Macho, MD         [place]    Status: Outpatient  Diagnosis: The encounter diagnosis was Metastatic lung carcinoma, left.  Current Dose: 4 Gy  Current Fraction: 1 of 4  Planned Dose: 16 Gy  Narrative: [name] Pacheco was seen today for weekly treatment management. The chart was checked and electron setups  were reviewed. She is tolerating the treatments well without any side effects this time.  Review of patient's allergies indicates no known allergies. Current Outpatient Prescriptions  Medication Sig Dispense Refill  . Alum & Mag Hydroxide-Simeth (MAGIC MOUTHWASH) SOLN Swish and spit 15 mLs 3 (three) times daily as needed (for thrush).      . benzonatate (TESSALON) 200 MG capsule Take 1 capsule (200 mg total) by mouth 3 (three) times daily as needed for cough (HOSPICE PATIENT).  50 capsule  0  . Calcium Carbonate-Vitamin D (CALTRATE 600+D PO) Take 1 tablet by mouth 2 (two) times daily.      . Cholecalciferol (VITAMIN D) 2000 UNITS CAPS Take 2,000 Units by mouth daily.       Marland Kitchen dronabinol (MARINOL) 2.5 MG capsule Take 1 capsule (2.5 mg total) by mouth 3 (three) times daily before meals. HOSPICE PATIENT  90 capsule  0  . enoxaparin (LOVENOX) 120 MG/0.8ML injection Inject 0.73 mLs (110 mg total) into the skin daily.  30 Syringe  0  . ezetimibe (ZETIA) 10 MG tablet Take 10 mg by mouth every evening.      . feeding supplement (RESOURCE BREEZE) LIQD Take 1 Container by mouth 3 (three) times daily between meals.      .  fentaNYL (DURAGESIC - DOSED MCG/HR) 25 MCG/HR Place 1 patch (25 mcg total) onto the skin every 3 (three) days. HOSPICE PATIENT  10 patch  0  . fluconazole (DIFLUCAN) 100 MG tablet       . folic acid (FOLVITE) 1 MG tablet Take 1 mg by mouth daily.      Marland Kitchen HYDROmorphone (DILAUDID) 2 MG tablet Take 1-2 tablets (2-4 mg total) by mouth every 4 (four) hours as needed for pain (HOSPICE PATIENT).  90 tablet  0  . lidocaine-prilocaine (EMLA) cream Apply 1 application topically as needed (for port-a-cath access).       . LORazepam (ATIVAN) 0.5 MG tablet Take 0.5 mg by mouth every 6 (six) hours as needed.       Marland Kitchen Lysine 500 MG TABS Take 500 mg by mouth daily as needed (out breaks).       . metoCLOPramide (REGLAN) 10 MG tablet Take 1 tablet (10 mg total) by mouth every 6 (six) hours as needed (Nausea & vomiting. HOSPICE PATIENT).  60 tablet  1  . Multiple Vitamin (MULTIVITAMIN WITH MINERALS) TABS Take 1 tablet by  mouth daily.      Marland Kitchen omeprazole (PRILOSEC) 20 MG capsule Take 20 mg by mouth daily.      . pramipexole (MIRAPEX) 1 MG tablet Take 1-1.5 mg by mouth 3 (three) times daily. 1 tablets bid and 1.5 tablets at bedtime      . pyridostigmine (MESTINON) 60 MG tablet Take 60 mg by mouth 3 (three) times daily.      Marland Kitchen triamterene-hydrochlorothiazide (MAXZIDE-25) 37.5-25 MG per tablet Take by mouth.       . valACYclovir (VALTREX) 500 MG tablet Take 1 tablet (500 mg total) by mouth daily as needed (for outbreaks HOSPICE PATIENT).  30 tablet  1  . zolpidem (AMBIEN CR) 6.25 MG CR tablet Take 6.25 mg by mouth at bedtime.        No current facility-administered medications for this encounter.   Labs:  Lab Results  Component Value Date   WBC 12.9* 12/22/2012   HGB 10.9* 12/22/2012   HCT 34.2* 12/22/2012   MCV 95 12/22/2012   PLT 434* 12/22/2012   Lab Results  Component Value Date   CREATININE 0.6 12/22/2012   BUN 8 12/22/2012   NA 132 12/22/2012   K 3.8 12/22/2012   CL 94* 12/22/2012   CO2 29 12/22/2012   Lab Results   Component Value Date   ALT 49* 10/28/2012   AST 24 12/22/2012   BILITOT 0.70 12/22/2012    Physical Examination:  vitals were not taken for this visit.   Wt Readings from Last 3 Encounters:  12/22/12 157 lb (71.215 kg)  12/21/12 159 lb (72.122 kg)  12/10/12 157 lb 3.2 oz (71.305 kg)    The tumorous nodules along the sternum and right upper back are unchanged on exam today. There is no drainage or skin erosion.                                                                                                                                      Assessment:  Patient tolerating treatments well  Plan: Continue treatment per original radiation prescription

## 2012-12-30 ENCOUNTER — Ambulatory Visit
Admission: RE | Admit: 2012-12-30 | Discharge: 2012-12-30 | Disposition: A | Discharge status: Home / Self Care | Source: Ambulatory Visit | Attending: Radiation Oncology | Admitting: Radiation Oncology

## 2012-12-31 ENCOUNTER — Other Ambulatory Visit: Payer: Medicare Other | Admitting: Lab

## 2012-12-31 ENCOUNTER — Ambulatory Visit: Payer: Medicare Other | Admitting: Hematology & Oncology

## 2012-12-31 ENCOUNTER — Ambulatory Visit
Admission: RE | Admit: 2012-12-31 | Discharge: 2012-12-31 | Disposition: A | Discharge status: Home / Self Care | Source: Ambulatory Visit | Attending: Radiation Oncology | Admitting: Radiation Oncology

## 2012-12-31 ENCOUNTER — Ambulatory Visit: Payer: Medicare Other

## 2013-01-01 ENCOUNTER — Ambulatory Visit
Admission: RE | Admit: 2013-01-01 | Discharge: 2013-01-01 | Disposition: A | Discharge status: Home / Self Care | Source: Ambulatory Visit | Attending: Radiation Oncology | Admitting: Radiation Oncology

## 2013-01-01 ENCOUNTER — Encounter: Payer: Self-pay | Admitting: Radiation Oncology

## 2013-01-01 VITALS — BP 100/74 | HR 122 | Temp 97.7°F | Resp 20 | Wt 154.2 lb

## 2013-01-01 DIAGNOSIS — C7802 Secondary malignant neoplasm of left lung: Secondary | ICD-10-CM

## 2013-01-01 NOTE — Progress Notes (Addendum)
Pt completed radiation today to upper right back and sternal nodules. These areas are red, dry; advised she apply Radiaplex which she has at home. Pt c/o ongoing nausea, vomiting x 2 in past 24 hours. She states she begins coughing until she vomits, thick clear sputum and w/food if pt has recently eaten. She takes Compazine 1/4 tab 4 x daily and Tussinex prn for cough. Pt is Hospice pt. Pt fatigued, loss of appetite. She denies pain today. She is on Marinol. Gave pt 1 month FU card.

## 2013-01-01 NOTE — Progress Notes (Signed)
  Radiation Oncology         [phone] ________________________________  Name: [name] Pacheco MRN: [id number]  Date: 01/01/2013  DOB: 1945/11/05  Weekly Radiation Therapy Management  Current Dose: 16 Gy     Planned Dose:  16 Gy  Narrative . . . . . . . . The patient presents for her final routine under treatment assessment.                                            The patient is continuing to experience pain at the treated cutaneous sites.                                 Set-up films were reviewed.                                 The chart was checked. Physical Findings. . .  weight is 154 lb 3.2 oz (69.945 kg). Her oral temperature is 97.7 F (36.5 C). Her blood pressure is 100/74 and her pulse is 122. Her respiration is 20. Marland Kitchen Her raised erythematous skin nodules show intact skin with no breakdown or desquamation. Impression . . . . . . . The patient tolerated her radiation. Plan . . . . . . . . . . . . today, we will complete radiotherapy treatment as planned.  ________________________________  Artist Pais. Kathrynn Running, M.D.

## 2013-01-07 ENCOUNTER — Telehealth: Payer: Self-pay | Admitting: Nurse Practitioner

## 2013-01-13 ENCOUNTER — Encounter: Payer: Self-pay | Admitting: Radiation Oncology

## 2013-01-13 NOTE — Progress Notes (Signed)
  Radiation Oncology         [phone] ________________________________  Name: [name] Pacheco MRN: [id number]  Date: 01/13/2013  DOB: 04-Aug-1945  End of Treatment Note  Diagnosis:   Stage IV non-small cell lung cancer     Indication for treatment:  Painful subcutaneous metastasis       Radiation treatment dates:   July 15 through July 18  Site/dose:   #1 sternum region 16 gray in 4 fractions, #2 right upper back 16 gray in 4 fractions  Beams/energy:   6 MeV electrons were used for both treatment areas prescribed to the 95% isodose line. A 1 cm bolus was placed to ensure adequate dose to the superficial aspects of the target area.  Narrative: The patient tolerated radiation treatment relatively well.   Her pain had not improved after her fourth treatment, but the lesions would be expected to regress over the next few weeks  Plan: The patient has completed radiation treatment. The patient will return to radiation oncology clinic for routine followup in one month. I advised them to call or return sooner if they have any questions or concerns related to their recovery or treatment.  -----------------------------------  Billie Lade, PhD, MD

## 2013-01-15 DEATH — deceased

## 2013-01-20 ENCOUNTER — Other Ambulatory Visit: Payer: Self-pay

## 2013-01-21 ENCOUNTER — Other Ambulatory Visit: Payer: Medicare Other | Admitting: Lab

## 2013-01-21 ENCOUNTER — Ambulatory Visit: Payer: Medicare Other

## 2013-01-21 ENCOUNTER — Ambulatory Visit: Payer: Medicare Other | Admitting: Hematology & Oncology

## 2013-01-27 ENCOUNTER — Telehealth: Payer: Self-pay | Admitting: Dietician

## 2013-01-29 ENCOUNTER — Encounter: Payer: Self-pay | Admitting: Radiation Oncology

## 2013-01-29 DIAGNOSIS — Z515 Encounter for palliative care: Secondary | ICD-10-CM | POA: Insufficient documentation

## 2013-02-01 ENCOUNTER — Ambulatory Visit: Payer: Medicare Other | Attending: Radiation Oncology | Admitting: Radiation Oncology

## 2013-02-04 ENCOUNTER — Encounter: Payer: Self-pay | Admitting: Hematology & Oncology

## 2013-02-11 ENCOUNTER — Ambulatory Visit: Payer: Medicare Other | Admitting: Hematology & Oncology

## 2013-02-11 ENCOUNTER — Ambulatory Visit: Payer: Medicare Other

## 2013-02-11 ENCOUNTER — Other Ambulatory Visit: Payer: Medicare Other | Admitting: Lab

## 2013-02-24 ENCOUNTER — Ambulatory Visit: Payer: Medicare Other | Admitting: Neurology

## 2013-04-09 ENCOUNTER — Ambulatory Visit: Payer: Self-pay | Admitting: Nurse Practitioner

## 2013-07-08 IMAGING — CT CT CHEST W/ CM
2 of 4 series · 15 of 36 positions shown, 18 images · IV contrast (OMNIPAQUE)
Comparison: Today's PET.  Prior PET of 09/09/2012 and CT of
08/31/2012..

CLINICAL DATA: Chemotherapy for lung cancer.  Radiation therapy
complete.  Chest pain.  Cough.  Shortness of breath.  History of
Parkinson's disease.

CT CHEST WITH CONTRAST
TECHNIQUE: Multidetector CT imaging of the chest was performed
following the standard protocol during bolus administration of
intravenous contrast.
Contrast: 80mL OMNIPAQUE IOHEXOL 300 MG/ML  SOLN

[Series 2: chest with st · axial · 0.68mm/px · z∈[-258,-13]mm · 12 of 55 slices shown, 15 images]
[im 3/55  mediastinal]
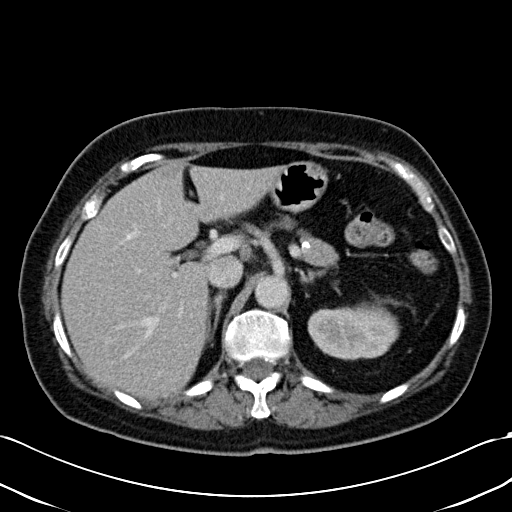
[im 3/55  lung]
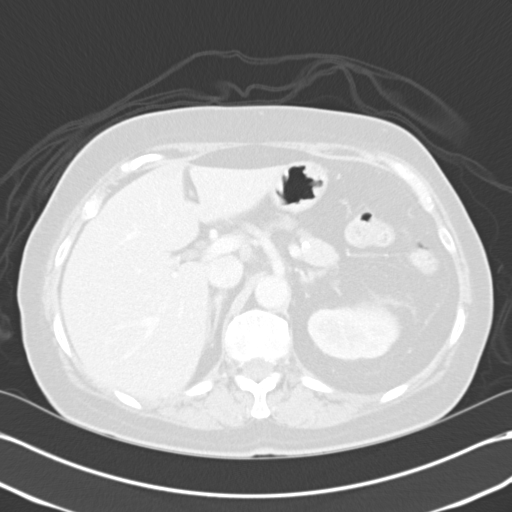
[im 8/55  lung]
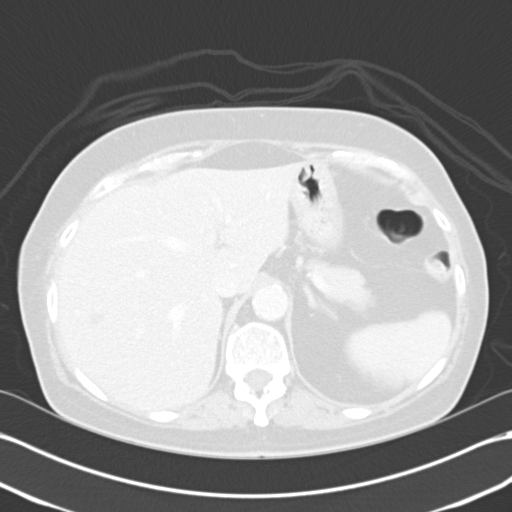
[im 12/55  lung]
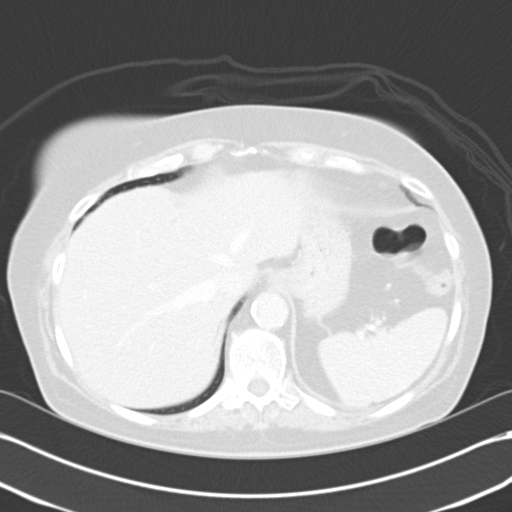
[im 17/55  lung]
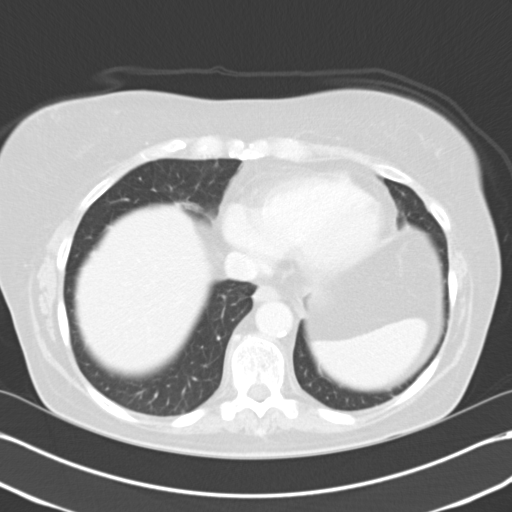
[im 22/55  mediastinal]
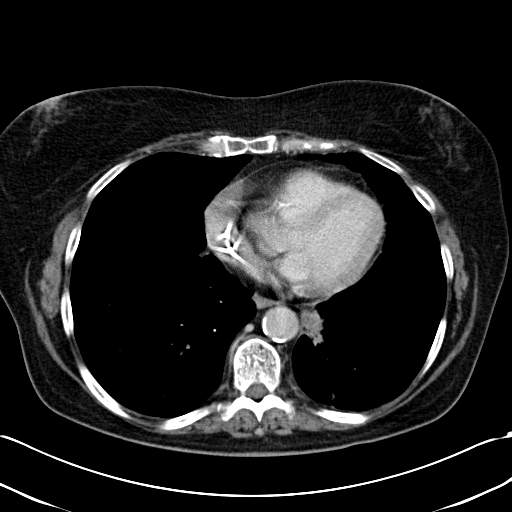
[im 22/55  lung]
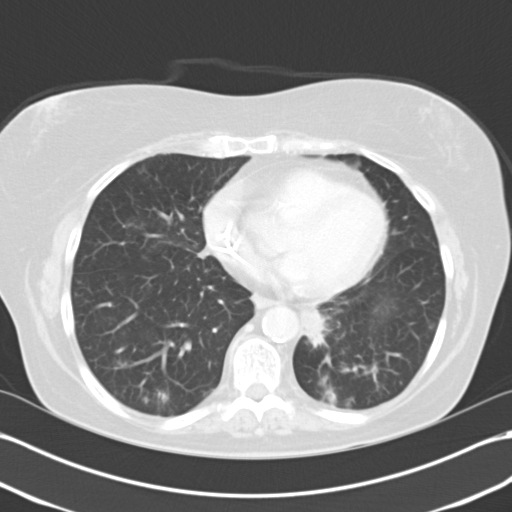
[im 26/55  lung]
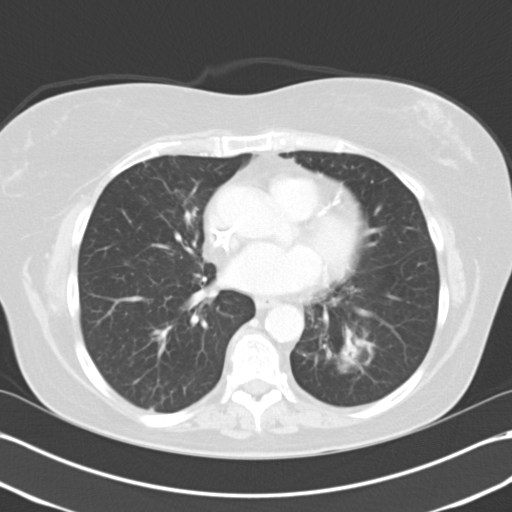
[im 29/55  lung]
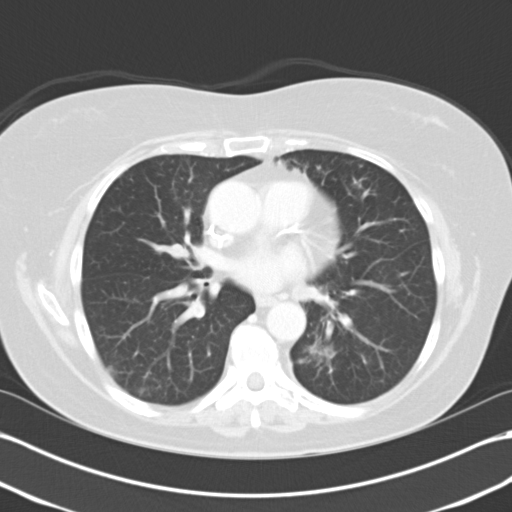
[im 33/55  lung]
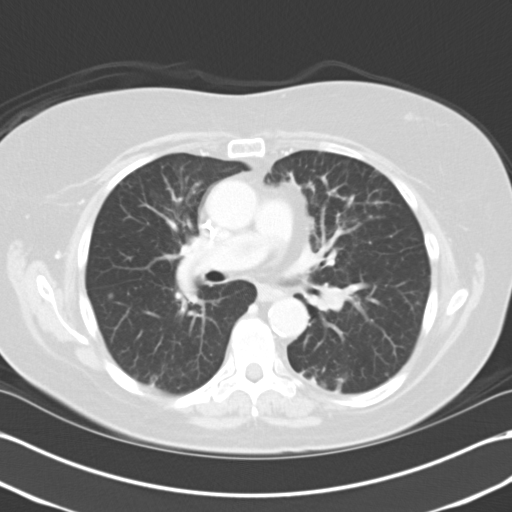
[im 38/55  mediastinal]
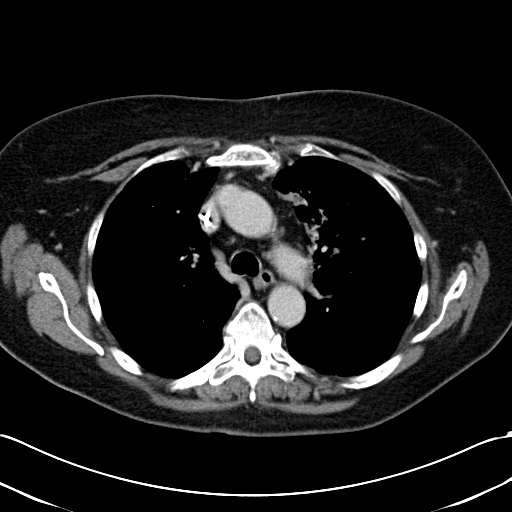
[im 38/55  lung]
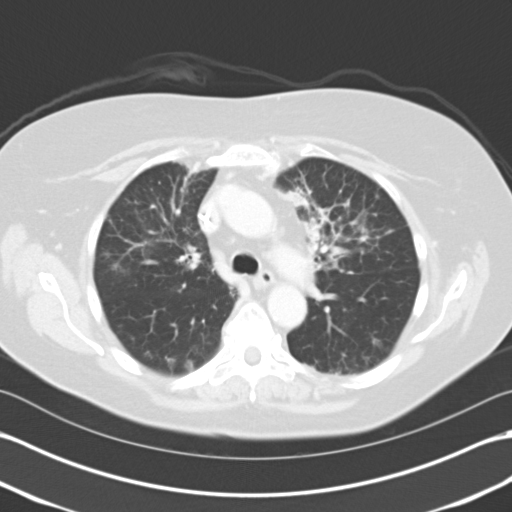
[im 43/55  lung]
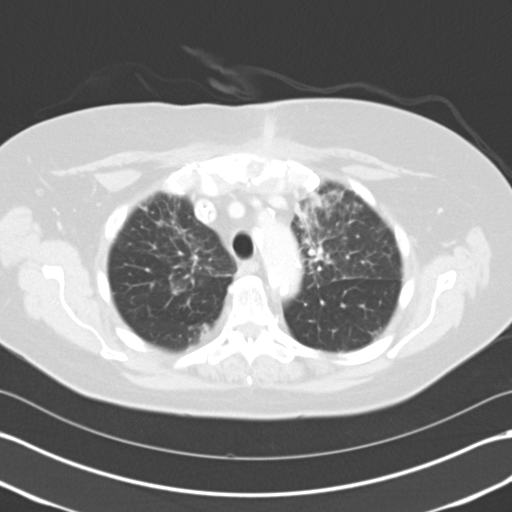
[im 47/55  lung]
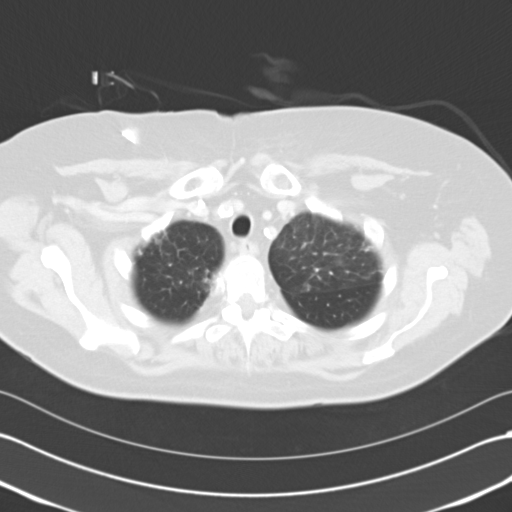
[im 52/55  lung]
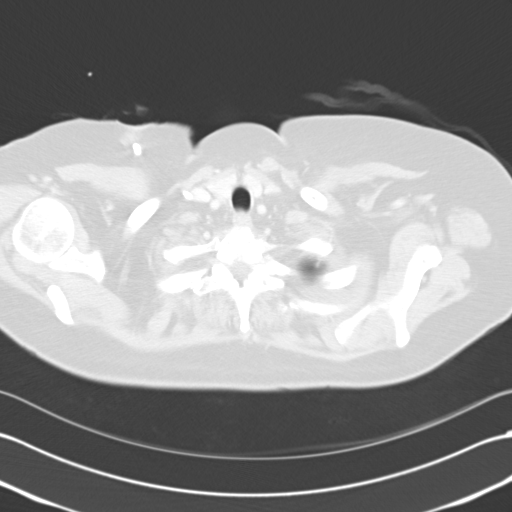

[Series 602: <mpr thick range> · coronal · 0.68mm/px · 3 of 78 slices shown]
[im 16/78  lung]
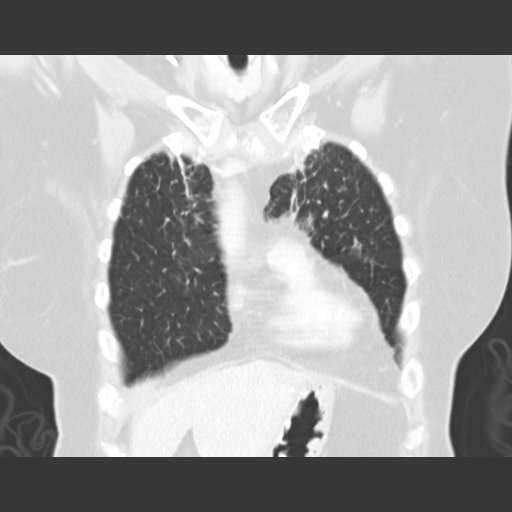
[im 31/78  lung]
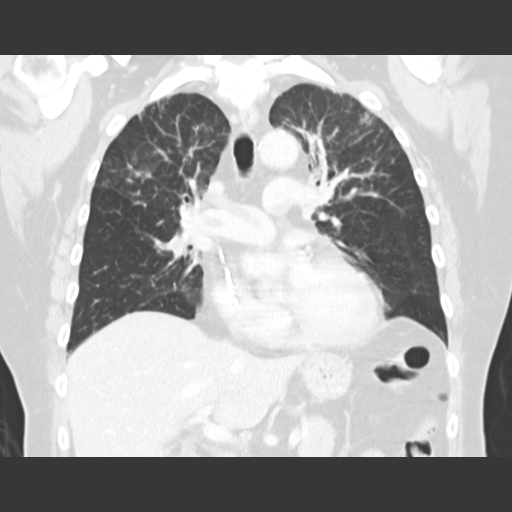
[im 47/78  lung]
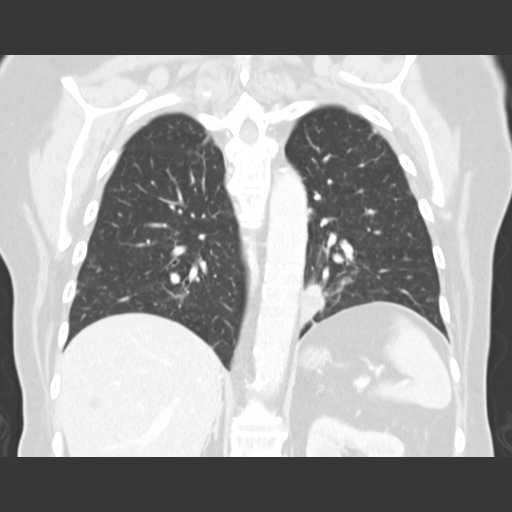

[15 of 36 positions shown; findings below may reference images not displayed]

FINDINGS: Lungs/pleura: Decreased size of central left lower lobe
lung nodule.  This measures 2.8 x 1.7 cm today versus 3.0 x 4.0 cm
at the same level on the prior exam.

Left paramediastinal interstitial opacity with traction
bronchiectasis, likely radiation induced.  Other scattered areas of
interstitial opacity identified throughout both lungs.  Somewhat
more confluent at the left lower lobe, including image 30/series 7.

No pleural fluid.   1.1 cm left-sided pleural nodule which
corresponds to hypermetabolism at PET.  Image 20 today.

Heart/Mediastinum: Incompletely imaged low left
jugular/supraclavicular adenopathy which corresponds to
hypermetabolism at PET. Right-sided Port-A-Cath which terminates at
the low SVC.

Normal heart size, without pericardial effusion.

Large central pulmonary embolism, with emboli identified within
both pulmonary arteries and the segmental branches lower lobes.
Example images 21 - 24/series 2.  No evidence of right heart
strain.  Subtle node inferior to the left inferior pulmonary vein
corresponds to hypermetabolism.  This measures 1.0 cm on image 27.

Upper abdomen: New right liver lobe 9 mm lesion image 48/series 2,
corresponding hypermetabolism at PET. More inferior right hepatic
lobe 8 mm lesion on image 4/series 4.  Normal adrenal glands.

Bones/Musculoskeletal:  Sternal manubrial metastasis again
identified.  Soft tissue density in the subcutaneous fat
superficially likely the site of biopsy.  Extensive osseous
metastasis are otherwise relatively CT occult.
IMPRESSION: 1.  Large burden central pulmonary embolism.  Findings were
discussed with Dr. Brando physician's Njie, Ichtiandras, at 12
[DATE] p.m. I also discussed findings with the patient and she was
sent to [REDACTED].  Decreased left lower lobe lung nodule size.
3.  Development of thoracic and lower cervical nodal metastasis.
4.  Progression of osseous metastasis, most apparent at PET.
5.  Development of hepatic metastasis.
6.  Left pleural metastasis, new.
7. At least partially radiation used interstitial opacity
throughout the lungs.  Difficult to exclude drug toxicity or early
lymphangitic tumor spread.

## 2013-08-03 NOTE — Telephone Encounter (Signed)
[facility], RN called requesting pt receive home IVF and anti-emetics. Wanted to make sure the orders had been faxed to Hospice prior to nurse going to her house.

## 2014-02-11 ENCOUNTER — Other Ambulatory Visit: Payer: Self-pay | Admitting: Pharmacist
